# Patient Record
Sex: Female | Born: 1990 | Race: Black or African American | Hispanic: No | Marital: Single | Smoking: Never smoker
Health system: Southern US, Community
[De-identification: ages and names within clinical notes are randomized; demographics above are authoritative.]

## PROBLEM LIST (undated history)

## (undated) DIAGNOSIS — R011 Cardiac murmur, unspecified: Secondary | ICD-10-CM

## (undated) DIAGNOSIS — T7840XA Allergy, unspecified, initial encounter: Secondary | ICD-10-CM

## (undated) DIAGNOSIS — Z309 Encounter for contraceptive management, unspecified: Secondary | ICD-10-CM

## (undated) DIAGNOSIS — Z9289 Personal history of other medical treatment: Secondary | ICD-10-CM

## (undated) HISTORY — DX: Cardiac murmur, unspecified: R01.1

## (undated) HISTORY — DX: Allergy, unspecified, initial encounter: T78.40XA

## (undated) HISTORY — DX: Encounter for contraceptive management, unspecified: Z30.9

## (undated) HISTORY — DX: Personal history of other medical treatment: Z92.89

---
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
## 2011-02-17 ENCOUNTER — Other Ambulatory Visit (INDEPENDENT_AMBULATORY_CARE_PROVIDER_SITE_OTHER): Payer: 59

## 2011-02-17 DIAGNOSIS — Z23 Encounter for immunization: Secondary | ICD-10-CM

## 2011-06-08 ENCOUNTER — Encounter: Payer: Self-pay | Admitting: Family Medicine

## 2011-06-21 ENCOUNTER — Encounter: Payer: Self-pay | Admitting: Family Medicine

## 2011-06-23 ENCOUNTER — Encounter: Payer: Self-pay | Admitting: Medical

## 2011-06-23 ENCOUNTER — Ambulatory Visit (INDEPENDENT_AMBULATORY_CARE_PROVIDER_SITE_OTHER): Payer: 59 | Admitting: Medical

## 2011-06-23 DIAGNOSIS — N946 Dysmenorrhea, unspecified: Secondary | ICD-10-CM

## 2011-06-23 DIAGNOSIS — Z309 Encounter for contraceptive management, unspecified: Secondary | ICD-10-CM

## 2011-06-23 DIAGNOSIS — R5381 Other malaise: Secondary | ICD-10-CM

## 2011-06-23 DIAGNOSIS — R5383 Other fatigue: Secondary | ICD-10-CM

## 2011-06-23 DIAGNOSIS — IMO0001 Reserved for inherently not codable concepts without codable children: Secondary | ICD-10-CM

## 2011-06-23 DIAGNOSIS — Z0000 Encounter for general adult medical examination without abnormal findings: Secondary | ICD-10-CM

## 2011-06-23 LAB — CBC WITH DIFFERENTIAL/PLATELET
Basophils Absolute: 0 10*3/uL (ref 0.0–0.1)
Basophils Relative: 0 % (ref 0–1)
HCT: 38.1 % (ref 36.0–46.0)
Hemoglobin: 12.6 g/dL (ref 12.0–15.0)
Lymphocytes Relative: 46 % (ref 12–46)
MCHC: 33.1 g/dL (ref 30.0–36.0)
Monocytes Absolute: 0.3 10*3/uL (ref 0.1–1.0)
Monocytes Relative: 5 % (ref 3–12)
Neutro Abs: 2.2 10*3/uL (ref 1.7–7.7)
Neutrophils Relative %: 47 % (ref 43–77)
WBC: 4.6 10*3/uL (ref 4.0–10.5)

## 2011-06-23 LAB — POCT URINALYSIS DIPSTICK
Blood, UA: NEGATIVE
Protein, UA: NEGATIVE
Spec Grav, UA: 1.015
Urobilinogen, UA: NEGATIVE
pH, UA: 6

## 2011-06-23 LAB — LIPID PANEL
Cholesterol: 123 mg/dL (ref 0–200)
HDL: 58 mg/dL (ref >39)
LDL Cholesterol: 54 mg/dL (ref 0–99)
Triglycerides: 53 mg/dL (ref <150)
VLDL: 11 mg/dL (ref 0–40)

## 2011-06-23 LAB — COMPREHENSIVE METABOLIC PANEL
Albumin: 5 g/dL (ref 3.5–5.2)
BUN: 8 mg/dL (ref 6–23)
CO2: 26 mEq/L (ref 19–32)
Calcium: 9.5 mg/dL (ref 8.4–10.5)
Chloride: 105 mEq/L (ref 96–112)
Creat: 0.84 mg/dL (ref 0.50–1.10)
Glucose, Bld: 81 mg/dL (ref 70–99)
Potassium: 4.1 mEq/L (ref 3.5–5.3)

## 2011-06-23 LAB — POCT URINE PREGNANCY: Preg Test, Ur: NEGATIVE

## 2011-06-23 MED ORDER — NORGESTIMATE-ETH ESTRADIOL 0.25-35 MG-MCG PO TABS: 1.0000 | ORAL_TABLET | Freq: Every day | ORAL | Status: DC

## 2011-06-23 NOTE — Patient Instructions (Signed)
I recommend you take a multivitamin daily.  Consider taking a vitamin B complex vitamin daily as well. Eat a variety of foods, particularly fruits and vegetables, exercise regularly.  Preventative Care for Adults - Female      MAINTAIN REGULAR HEALTH EXAMS:  A routine yearly physical is a good way to check in with your primary care provider about your health and preventive screening. It is also an opportunity to share updates about your health and any concerns you have, and receive a thorough all-over exam.   Most health insurance companies pay for at least some preventative services.  Check with your health plan for specific coverages.  WHAT PREVENTATIVE SERVICES DO WOMEN NEED?  Adult women should have their weight and blood pressure checked regularly.   Women age 55 and older should have their cholesterol levels checked regularly.  Women should be screened for cervical cancer with a Pap smear and pelvic exam beginning at either age 47, or 3 years after they become sexually activity.    Breast cancer screening generally begins at age 40 with a mammogram and breast exam by your primary care provider.    Beginning at age 57 and continuing to age 41, women should be screened for colorectal cancer.  Certain people may need continued testing until age 84.  Updating vaccinations is part of preventative care.  Vaccinations help protect against diseases such as the flu.  Osteoporosis is a disease in which the bones lose minerals and strength as we age. Women ages 49 and over should discuss this with their caregivers, as should women after menopause who have other risk factors.  Lab tests are generally done as part of preventative care to screen for anemia and blood disorders, to screen for problems with the kidneys and liver, to screen for bladder problems, to check blood sugar, and to check your cholesterol level.  Preventative services generally include counseling about diet, exercise,  avoiding tobacco, drugs, excessive alcohol consumption, and sexually transmitted infections.    GENERAL RECOMMENDATIONS FOR GOOD HEALTH:  Healthy diet:  Eat a variety of foods, including fruit, vegetables, animal or vegetable protein, such as meat, fish, chicken, and eggs, or beans, lentils, tofu, and grains, such as rice.  Drink plenty of water daily.  Decrease saturated fat in the diet, avoid lots of red meat, processed foods, sweets, fast foods, and fried foods.  Exercise:  Aerobic exercise helps maintain good heart health. At least 30-40 minutes of moderate-intensity exercise is recommended. For example, a brisk walk that increases your heart rate and breathing. This should be done on most days of the week.   Find a type of exercise or a variety of exercises that you enjoy so that it becomes a part of your daily life.  Examples are running, walking, swimming, water aerobics, and biking.  For motivation and support, explore group exercise such as aerobic class, spin class, Zumba, Yoga,or  martial arts, etc.    Set exercise goals for yourself, such as a certain weight goal, walk or run in a race such as a 5k walk/run.  Speak to your primary care provider about exercise goals.  Disease prevention:  If you smoke or chew tobacco, find out from your caregiver how to quit. It can literally save your life, no matter how long you have been a tobacco user. If you do not use tobacco, never begin.   Maintain a healthy diet and normal weight. Increased weight leads to problems with blood pressure and diabetes.  The Body Mass Index or BMI is a way of measuring how much of your body is fat. Having a BMI above 27 increases the risk of heart disease, diabetes, hypertension, stroke and other problems related to obesity. Your caregiver can help determine your BMI and based on it develop an exercise and dietary program to help you achieve or maintain this important measurement at a healthful  level.  High blood pressure causes heart and blood vessel problems.  Persistent high blood pressure should be treated with medicine if weight loss and exercise do not work.   Fat and cholesterol leaves deposits in your arteries that can block them. This causes heart disease and vessel disease elsewhere in your body.  If your cholesterol is found to be high, or if you have heart disease or certain other medical conditions, then you may need to have your cholesterol monitored frequently and be treated with medication.   Ask if you should have a cardiac stress test if your history suggests this. A stress test is a test done on a treadmill that looks for heart disease. This test can find disease prior to there being a problem.  Menopause can be associated with physical symptoms and risks. Hormone replacement therapy is available to decrease these. You should talk to your caregiver about whether starting or continuing to take hormones is right for you.   Osteoporosis is a disease in which the bones lose minerals and strength as we age. This can result in serious bone fractures. Risk of osteoporosis can be identified using a bone density scan. Women ages 46 and over should discuss this with their caregivers, as should women after menopause who have other risk factors. Ask your caregiver whether you should be taking a calcium supplement and Vitamin D, to reduce the rate of osteoporosis.   Avoid drinking alcohol in excess (more than two drinks per day).  Avoid use of street drugs. Do not share needles with anyone. Ask for professional help if you need assistance or instructions on stopping the use of alcohol, cigarettes, and/or drugs.  Brush your teeth twice a day with fluoride toothpaste, and floss once a day. Good oral hygiene prevents tooth decay and gum disease. The problems can be painful, unattractive, and can cause other health problems. Visit your dentist for a routine oral and dental check up and  preventive care every 6-12 months.   Look at your skin regularly.  Use a mirror to look at your back. Notify your caregivers of changes in moles, especially if there are changes in shapes, colors, a size larger than a pencil eraser, an irregular border, or development of new moles.  Safety:  Use seatbelts 100% of the time, whether driving or as a passenger.  Use safety devices such as hearing protection if you work in environments with loud noise or significant background noise.  Use safety glasses when doing any work that could send debris in to the eyes.  Use a helmet if you ride a bike or motorcycle.  Use appropriate safety gear for contact sports.  Talk to your caregiver about gun safety.  Use sunscreen with a SPF (or skin protection factor) of 15 or greater.  Lighter skinned people are at a greater risk of skin cancer. Don't forget to also wear sunglasses in order to protect your eyes from too much damaging sunlight. Damaging sunlight can accelerate cataract formation.   Practice safe sex. Use condoms. Condoms are used for birth control and to help reduce the  spread of sexually transmitted infections (or STIs).  Some of the STIs are gonorrhea (the clap), chlamydia, syphilis, trichomonas, herpes, HPV (human papilloma virus) and HIV (human immunodeficiency virus) which causes AIDS. The herpes, HIV and HPV are viral illnesses that have no cure. These can result in disability, cancer and death.   Keep carbon monoxide and smoke detectors in your home functioning at all times. Change the batteries every 6 months or use a model that plugs into the wall.   Vaccinations:  Stay up to date with your tetanus shots and other required immunizations. You should have a booster for tetanus every 10 years. Be sure to get your flu shot every year, since 5%-20% of the [place] population comes down with the flu. The flu vaccine changes each year, so being vaccinated once is not enough. Get your shot in the fall, before  the flu season peaks.   Other vaccines to consider:  Human Papilloma Virus or HPV causes cancer of the cervix, and other infections that can be transmitted from person to person. There is a vaccine for HPV, and females should get immunized between the ages of 53 and 45. It requires a series of 3 shots.   Pneumococcal vaccine to protect against certain types of pneumonia.  This is normally recommended for adults age 43 or older.  However, adults younger than 19 years old with certain underlying conditions such as diabetes, heart or lung disease should also receive the vaccine.  Shingles vaccine to protect against Varicella Zoster if you are older than age 72, or younger than 19 years old with certain underlying illness.  Hepatitis A vaccine to protect against a form of infection of the liver by a virus acquired from food.  Hepatitis B vaccine to protect against a form of infection of the liver by a virus acquired from blood or body fluids, particularly if you work in health care.  If you plan to travel internationally, check with your local health department for specific vaccination recommendations.  Cancer Screening:  Breast cancer screening is essential to preventive care for women. All women age 39 and older should perform a breast self-exam every month. At age 91 and older, women should have their caregiver complete a breast exam each year. Women at ages 33 and older should have a mammogram (x-ray film) of the breasts. Your caregiver can discuss how often you need mammograms.    Cervical cancer screening includes taking a Pap smear (sample of cells examined under a microscope) from the cervix (end of the uterus). It also includes testing for HPV (Human Papilloma Virus, which can cause cervical cancer). Screening and a pelvic exam should begin at age 35, or 3 years after a woman becomes sexually active. Screening should occur every year, with a Pap smear but no HPV testing, up to age 18. After  age 27, you should have a Pap smear every 3 years with HPV testing, if no HPV was found previously.   Most routine colon cancer screening begins at the age of 44. On a yearly basis, doctors may provide special easy to use take-home tests to check for hidden blood in the stool. Sigmoidoscopy or colonoscopy can detect the earliest forms of colon cancer and is life saving. These tests use a small camera at the end of a tube to directly examine the colon. Speak to your caregiver about this at age 51, when routine screening begins (and is repeated every 5 years unless early forms of pre-cancerous polyps or  small growths are found).

## 2011-06-23 NOTE — Progress Notes (Signed)
Subjective:   HPI  Betty Bentley is a 20 y.o. female who presents for a complete physical.  She has a few c/o.  Lately has felt fatigue, less energy than normal.  She denies depression although periods get her down.  Periods are heavy and with bad cramps.  She wants to take OCPs to help with periods.  Her mother is a Publishing rights manager and wants her to have some screening labs today given her fatigue.  Otherwise she has been healthy, runs track, is in school at [employer] for biology.  She denies prior pap smear and has never been sexually active. No other c/o.  Reviewed their medical, surgical, family, social, medication, and allergy history and updated chart as appropriate.  History reviewed. No pertinent past medical history.   Family History  Problem Relation Age of Onset  . Arthritis Maternal Grandfather   . Hypertension Maternal Grandfather   . Thyroid disease Maternal Grandmother   . Heart disease Neg Hx   . Stroke Neg Hx   . Diabetes Neg Hx     No current outpatient prescriptions on file prior to visit.    No Known Allergies   Review of Systems Constitutional: +fatigue; denies fever, chills, sweats, unexpected weight change, anorexia Allergy: negative; denies recent sneezing, itching, congestion Dermatology: denies changing moles, rash, lumps, new worrisome lesions ENT: no runny nose, ear pain, sore throat, hoarseness, sinus pain, teeth pain, tinnitus, hearing loss, epistaxis Cardiology: denies chest pain, palpitations, edema, orthopnea, paroxysmal nocturnal dyspnea Respiratory: denies cough, shortness of breath, dyspnea on exertion, wheezing, hemoptysis Gastroenterology: denies abdominal pain, nausea, vomiting, diarrhea, constipation, blood in stool, changes in bowel movement, dysphagia Hematology: denies bleeding or bruising problems Musculoskeletal: denies arthralgias, myalgias, joint swelling, back pain, neck pain, cramping, gait changes Ophthalmology: denies  vision changes, eye redness, itching, discharge Urology: denies dysuria, difficulty urinating, hematuria, urinary frequency, urgency, incontinence Neurology: no headache, weakness, tingling, numbness, speech abnormality, memory loss, falls, dizziness Psychology: denies depressed mood, agitation, sleep problems     Objective:   Physical Exam  Filed Vitals:   06/23/11 1433  BP: 120/70  Pulse: 72  Temp: 98.1 F (36.7 C)    General appearance: alert, no distress, WD/WN, black female, tall, lean Skin: mild acne of face and back, otherwise no worrisome lesions HEENT: normocephalic, conjunctiva/corneas normal, sclerae anicteric, PERRLA, EOMi, nares patent, no discharge or erythema, pharynx normal Oral cavity: MMM, tongue normal, teeth normal Neck: supple, no lymphadenopathy, no thyromegaly, no masses, normal ROM, no bruits Chest: non tender, normal shape and expansion Breasts: nontender, no masses or lumps, no skin changes, no axillary lymphadenopathy, benign, chaperoned by nurse Heart: RRR, normal S1, S2, no murmurs Lungs: CTA bilaterally, no wheezes, rhonchi, or rales Abdomen: +bs, soft, non tender, non distended, no masses, no hepatomegaly, no splenomegaly, no bruits Back: non tender, normal ROM, no scoliosis Musculoskeletal: upper extremities non tender, no obvious deformity, normal ROM throughout, lower extremities non tender, no obvious deformity, normal ROM throughout Extremities: no edema, no cyanosis, no clubbing Pulses: 2+ symmetric, upper and lower extremities, normal cap refill Neurological: alert, oriented x 3, CN2-12 intact, strength normal upper extremities and lower extremities, sensation normal throughout, DTRs 2+ throughout, no cerebellar signs, gait normal Psychiatric: normal affect, behavior normal, pleasant  Pelvic: deferred   Assessment :    Encounter Diagnoses  Name Primary?  . General medical examination Yes  . Fatigue   . Dysmenorrhea   . Contraception        Plan:  Physical exam - discussed healthy lifestyle, diet, exercise, preventative care, vaccinations, and addressed their concerns.    Fatigue - recommend healthy diet, daily multivitamin and B complex vitamins, labs today  Dysmenorrhea and contraception - discussed risks of contraception, will begin Sprintec. She denies prior sexual intercourse or activity, thus no pelvic/pap performed. Breast exam and discussion of SBE performed.  Advised pap by age 41.

## 2011-06-24 ENCOUNTER — Telehealth: Payer: Self-pay | Admitting: *Deleted

## 2011-06-24 NOTE — Telephone Encounter (Addendum)
Message copied by Dorthula Perfect on Fri Jun 24, 2011 12:40 PM ------      Message from: Aleen Campi, DAVID S      Created: Fri Jun 24, 2011 12:23 PM       ALL labs normal- liver, kidney, lytes, blood counts, urine, thyroid and Vit D.   Ask her to take daily multivitamin + Vitamin B complex OTC, eat breakfast every day, small meals throughout the day with good healthy choices such as fruits, whole grains, berries, etc.   Have her call report in 75mo to let us know how she is doing.  Pt notified of lab results.  Pt will take OTC Multivitamin with Vitamin B complex and will call in 1 month to let us know how she is doing.  Pt will be by Monday to pick up copy of lab results.  Printed out copy of labs for pt.  CM, LPN

## 2012-06-09 ENCOUNTER — Other Ambulatory Visit: Payer: Self-pay | Admitting: Medical

## 2012-06-11 NOTE — Telephone Encounter (Signed)
PATIENT NEEDS TO SCHEDULE A OFFICE VISIT BEFORE HER RX RUNS OUT.

## 2012-06-18 ENCOUNTER — Ambulatory Visit (INDEPENDENT_AMBULATORY_CARE_PROVIDER_SITE_OTHER): Payer: 59 | Admitting: Medical

## 2012-06-18 ENCOUNTER — Encounter: Payer: Self-pay | Admitting: Medical

## 2012-06-18 VITALS — BP 100/68 | HR 80 | Temp 98.4°F | Resp 16 | Ht 68.5 in | Wt 146.0 lb

## 2012-06-18 DIAGNOSIS — J309 Allergic rhinitis, unspecified: Secondary | ICD-10-CM

## 2012-06-18 DIAGNOSIS — Z0000 Encounter for general adult medical examination without abnormal findings: Secondary | ICD-10-CM

## 2012-06-18 DIAGNOSIS — Z309 Encounter for contraceptive management, unspecified: Secondary | ICD-10-CM

## 2012-06-18 LAB — POCT URINALYSIS DIPSTICK
Blood, UA: NEGATIVE
Ketones, UA: NEGATIVE
Spec Grav, UA: 1.02
pH, UA: 5

## 2012-06-18 MED ORDER — NORGESTIMATE-ETH ESTRADIOL 0.25-35 MG-MCG PO TABS: 1.0000 | ORAL_TABLET | Freq: Every day | ORAL | Status: DC

## 2012-06-18 MED ORDER — LORATADINE 10 MG PO TABS: 10.0000 mg | ORAL_TABLET | Freq: Every day | ORAL | Status: DC

## 2012-06-18 NOTE — Progress Notes (Signed)
Subjective:   HPI  Betty Bentley is a 21 y.o. female who presents for a complete physical.  I last saw her a year ago for the same.   She has been doing well.  In school for biology/pre-med at [employer].   making A-Bs, focused on her education, eating healthy, exercising.   She does have some allergy problems.  Gets runny nose, sneezing, cough, irritated throat with recent changes in temperature outside. Uses Claritin some.  Wants script for generic loratadine.     LMP 2 wk ago.  Has been on Sprintec 1 year now which has greatly helped both her painful and irregular periods.  Periods are now regular, every 28 days, not heavy, doing well, needs refill.  Reviewed their medical, surgical, family, social, medication, and allergy history and updated chart as appropriate.  Past Medical History  Diagnosis Date  . Allergy   . Contraception management      Family History  Problem Relation Age of Onset  . Arthritis Maternal Grandfather   . Hypertension Maternal Grandfather   . Thyroid disease Maternal Grandmother   . Heart disease Neg Hx   . Stroke Neg Hx   . Diabetes Neg Hx     Current Outpatient Prescriptions on File Prior to Visit  Medication Sig Dispense Refill  . DISCONTD: SPRINTEC 28 0.25-35 MG-MCG tablet TAKE ONE TABLET BY MOUTH EVERY DAY  28 each  0  . loratadine (CLARITIN) 10 MG tablet Take 1 tablet (10 mg total) by mouth daily.  30 tablet  11    No Known Allergies   Review of Systems Constitutional: denies fever, chills, sweats, unexpected weight change, anorexia, fatigue Allergy: negative; denies recent sneezing, itching, congestion Dermatology: denies changing moles, rash, lumps, new worrisome lesions ENT: no runny nose, ear pain, sore throat, hoarseness, sinus pain, teeth pain, tinnitus, hearing loss, epistaxis Cardiology: denies chest pain, palpitations, edema, orthopnea, paroxysmal nocturnal dyspnea Respiratory: denies cough, shortness of breath, dyspnea on  exertion, wheezing, hemoptysis Gastroenterology: denies abdominal pain, nausea, vomiting, diarrhea, constipation, blood in stool, changes in bowel movement, dysphagia Hematology: denies bleeding or bruising problems Musculoskeletal: denies arthralgias, myalgias, joint swelling, back pain, neck pain, cramping, gait changes Ophthalmology: denies vision changes, eye redness, itching, discharge Urology: denies dysuria, difficulty urinating, hematuria, urinary frequency, urgency, incontinence Neurology: no headache, weakness, tingling, numbness, speech abnormality, memory loss, falls, dizziness Psychology: denies depressed mood, agitation, sleep problems     Objective:   Physical Exam  Filed Vitals:   06/18/12 1112  BP: 100/68  Pulse: 80  Temp: 98.4 F (36.9 C)  Resp: 16    General appearance: alert, no distress, WD/WN, black female, tall, lean Skin: mild acne of face and back, otherwise no worrisome lesions HEENT: normocephalic, conjunctiva/corneas normal, sclerae anicteric, PERRLA, EOMi, nares patent, no discharge or erythema, pharynx normal Oral cavity: MMM, tongue normal, teeth normal Neck: supple, no lymphadenopathy, no thyromegaly, no masses, normal ROM, no bruits Chest: non tender, normal shape and expansion Breasts: deferred Heart: RRR, normal S1, S2, no murmurs Lungs: CTA bilaterally, no wheezes, rhonchi, or rales Abdomen: +bs, soft, non tender, non distended, no masses, no hepatomegaly, no splenomegaly, no bruits Back: non tender, normal ROM, no scoliosis Musculoskeletal: upper extremities non tender, no obvious deformity, normal ROM throughout, lower extremities non tender, no obvious deformity, normal ROM throughout Extremities: no edema, no cyanosis, no clubbing Pulses: 2+ symmetric, upper and lower extremities, normal cap refill Neurological: alert, oriented x 3, CN2-12 intact, strength normal upper extremities and lower  extremities, sensation normal throughout, DTRs 2+  throughout, no cerebellar signs, gait normal Psychiatric: normal affect, behavior normal, pleasant  Pelvic: deferred   Assessment :    Encounter Diagnoses  Name Primary?  . Routine general medical examination at a health care facility Yes  . Contraception management   . Allergic rhinitis       Plan:    Physical exam - discussed healthy lifestyle, diet, exercise, preventative care, vaccinations, and addressed their concerns.  She is UTD on vaccines.  Advised yearly flu shot in the fall.  Reviewed labs from 06/2011, all normal.   Contraception management - discussed risks/benefits of OCPs, discussed safe sex, prevention.  She has done well on Sprintec.  C/t same medication.  Will need pap age 16yo.    Allergic rhinitis - avoid triggers, script for Loratidine, cal/return if not helping.   RTC 1 year, sooner prn.

## 2012-06-20 ENCOUNTER — Telehealth: Payer: Self-pay | Admitting: Medical

## 2012-07-20 ENCOUNTER — Telehealth: Payer: Self-pay | Admitting: Medical

## 2012-07-20 DIAGNOSIS — Z0000 Encounter for general adult medical examination without abnormal findings: Secondary | ICD-10-CM

## 2012-07-20 MED ORDER — LORATADINE 10 MG PO TABS: 10.0000 mg | ORAL_TABLET | Freq: Every day | ORAL | Status: DC

## 2012-07-20 NOTE — Telephone Encounter (Signed)
LM

## 2013-05-22 ENCOUNTER — Telehealth: Payer: Self-pay | Admitting: Medical

## 2013-05-22 ENCOUNTER — Other Ambulatory Visit: Payer: Self-pay | Admitting: Medical

## 2013-05-22 NOTE — Telephone Encounter (Signed)
Refill sent.

## 2013-05-22 NOTE — Telephone Encounter (Signed)
She should have refill through July (last physical date).   I prescribe year's worth.  Thus, make sure or call out refill to get her through til physical time.

## 2013-06-11 DIAGNOSIS — R011 Cardiac murmur, unspecified: Secondary | ICD-10-CM

## 2013-06-11 DIAGNOSIS — Z9289 Personal history of other medical treatment: Secondary | ICD-10-CM

## 2013-06-11 HISTORY — DX: Cardiac murmur, unspecified: R01.1

## 2013-06-11 HISTORY — DX: Personal history of other medical treatment: Z92.89

## 2013-06-17 ENCOUNTER — Telehealth: Payer: Self-pay | Admitting: Medical

## 2013-06-17 MED ORDER — NORGESTIMATE-ETH ESTRADIOL 0.25-35 MG-MCG PO TABS: 1.0000 | ORAL_TABLET | Freq: Every day | ORAL | Status: DC

## 2013-06-17 NOTE — Telephone Encounter (Signed)
Medication refill sent to the pharmacy. CLS Appointment 07/10/13

## 2013-07-03 ENCOUNTER — Encounter: Payer: Self-pay | Admitting: Internal Medicine

## 2013-07-10 ENCOUNTER — Encounter: Payer: Self-pay | Admitting: Internal Medicine

## 2013-07-10 ENCOUNTER — Other Ambulatory Visit (HOSPITAL_COMMUNITY)
Admission: RE | Admit: 2013-07-10 | Discharge: 2013-07-10 | Disposition: A | Discharge status: Home / Self Care | Payer: 59 | Source: Ambulatory Visit | Attending: Medical | Admitting: Medical

## 2013-07-10 ENCOUNTER — Encounter: Payer: Self-pay | Admitting: Medical

## 2013-07-10 ENCOUNTER — Telehealth: Payer: Self-pay | Admitting: Internal Medicine

## 2013-07-10 ENCOUNTER — Ambulatory Visit (INDEPENDENT_AMBULATORY_CARE_PROVIDER_SITE_OTHER): Payer: 59 | Admitting: Medical

## 2013-07-10 VITALS — BP 110/66 | HR 76 | Temp 98.3°F | Resp 20 | Ht 69.0 in | Wt 139.0 lb

## 2013-07-10 DIAGNOSIS — Z113 Encounter for screening for infections with a predominantly sexual mode of transmission: Secondary | ICD-10-CM | POA: Insufficient documentation

## 2013-07-10 DIAGNOSIS — Z0000 Encounter for general adult medical examination without abnormal findings: Secondary | ICD-10-CM

## 2013-07-10 DIAGNOSIS — Z124 Encounter for screening for malignant neoplasm of cervix: Secondary | ICD-10-CM

## 2013-07-10 DIAGNOSIS — Z01419 Encounter for gynecological examination (general) (routine) without abnormal findings: Secondary | ICD-10-CM | POA: Insufficient documentation

## 2013-07-10 DIAGNOSIS — R011 Cardiac murmur, unspecified: Secondary | ICD-10-CM

## 2013-07-10 DIAGNOSIS — R002 Palpitations: Secondary | ICD-10-CM

## 2013-07-10 LAB — CBC WITH DIFFERENTIAL/PLATELET
Eosinophils Absolute: 0.1 10*3/uL (ref 0.0–0.7)
Hemoglobin: 12.3 g/dL (ref 12.0–15.0)
Lymphocytes Relative: 45 % (ref 12–46)
Lymphs Abs: 2.4 10*3/uL (ref 0.7–4.0)
Monocytes Relative: 6 % (ref 3–12)
Neutro Abs: 2.6 10*3/uL (ref 1.7–7.7)
Neutrophils Relative %: 47 % (ref 43–77)
Platelets: 279 10*3/uL (ref 150–400)
RBC: 3.91 MIL/uL (ref 3.87–5.11)
WBC: 5.4 10*3/uL (ref 4.0–10.5)

## 2013-07-10 LAB — POCT URINALYSIS DIPSTICK
Bilirubin, UA: NEGATIVE
Blood, UA: NEGATIVE
Glucose, UA: NEGATIVE
Nitrite, UA: NEGATIVE
Spec Grav, UA: 1.02

## 2013-07-10 LAB — HM PAP SMEAR: HM Pap smear: NEGATIVE

## 2013-07-10 MED ORDER — NORGESTIMATE-ETH ESTRADIOL 0.25-35 MG-MCG PO TABS: 1.0000 | ORAL_TABLET | Freq: Every day | ORAL | Status: DC

## 2013-07-10 MED ORDER — LORATADINE 10 MG PO TABS: 10.0000 mg | ORAL_TABLET | Freq: Every day | ORAL | Status: DC

## 2013-07-10 NOTE — Progress Notes (Signed)
Subjective:   HPI  Betty Bentley is a 22 y.o. female who presents for a complete physical.  I last saw her a year ago for the same.   She has been doing well.  In school for biology/pre-med at [employer].   making A-Bs, focused on her education, eating healthy, exercising.  Will be taking MCAT next month, plans to pursue medicine.    Takes Claritin for allergies without c/o.  Compliant with OCPs, no concerns, no side effects, periods regular.  Began having sex age 49yo, has had 3 partners, but uses condoms every time.  Today will be her first pap smear.  She did have STD screening at student health age 53yo.   Gets occasional palpitations, heaviness in chest, worse with stress. No family hx/o heart disease, no hx/o murmur, no prior heart problems, no major concerns with exercise, no CP, SOB, edema.   Reviewed their medical, surgical, family, social, medication, and allergy history and updated chart as appropriate.  Past Medical History  Diagnosis Date  . Allergy   . Contraception management      Family History  Problem Relation Age of Onset  . Arthritis Maternal Grandfather   . Hypertension Maternal Grandfather   . Thyroid disease Maternal Grandmother   . Heart disease Neg Hx   . Stroke Neg Hx   . Diabetes Neg Hx     Current Outpatient Prescriptions on File Prior to Visit  Medication Sig Dispense Refill  . loratadine (CLARITIN) 10 MG tablet Take 1 tablet (10 mg total) by mouth daily.  90 tablet  3   No current facility-administered medications on file prior to visit.    No Known Allergies   Review of Systems Constitutional: denies fever, chills, sweats, unexpected weight change, anorexia, fatigue Allergy: negative; denies recent sneezing, itching, congestion Dermatology: denies changing moles, rash, lumps, new worrisome lesions ENT: no runny nose, ear pain, sore throat, hoarseness, sinus pain, teeth pain, tinnitus, hearing loss, epistaxis Cardiology: denies chest  pain, +occasional palpitations, no edema, orthopnea, paroxysmal nocturnal dyspnea Respiratory: denies cough, shortness of breath, dyspnea on exertion, wheezing, hemoptysis Gastroenterology: denies abdominal pain, nausea, vomiting, diarrhea, constipation, blood in stool, changes in bowel movement, dysphagia Hematology: denies bleeding or bruising problems Musculoskeletal: denies arthralgias, myalgias, joint swelling, back pain, neck pain, cramping, gait changes Ophthalmology: denies vision changes, eye redness, itching, discharge Urology: denies dysuria, difficulty urinating, hematuria, urinary frequency, urgency, incontinence Neurology: no headache, weakness, tingling, numbness, speech abnormality, memory loss, falls, dizziness Psychology: denies depressed mood, agitation, sleep problems     Objective:   Physical Exam  Filed Vitals:   07/10/13 1135  BP: 110/66  Pulse: 76  Temp: 98.3 F (36.8 C)  Resp: 20    General appearance: alert, no distress, WD/WN, black female, tall, lean Skin: mild acne of face and back, otherwise no worrisome lesions HEENT: normocephalic, conjunctiva/corneas normal, sclerae anicteric, PERRLA, EOMi, nares patent, no discharge or erythema, pharynx normal Oral cavity: MMM, tongue normal, teeth normal Neck: supple, no lymphadenopathy, no thyromegaly, no masses, normal ROM, no bruits Chest: non tender, normal shape and expansion Breasts: deferred Heart: brief faint 1-2/6 systolic murmur heard best left upper sternal border, otherwise RRR, normal  S2 Lungs: CTA bilaterally, no wheezes, rhonchi, or rales Abdomen: +bs, soft, non tender, non distended, no masses, no hepatomegaly, no splenomegaly, no bruits Back: non tender, normal ROM, no scoliosis Musculoskeletal: upper extremities non tender, no obvious deformity, normal ROM throughout, lower extremities non tender, no obvious deformity, normal ROM throughout  Extremities: no edema, no cyanosis, no  clubbing Pulses: 2+ symmetric, upper and lower extremities, normal cap refill Neurological: alert, oriented x 3, CN2-12 intact, strength normal upper extremities and lower extremities, sensation normal throughout, DTRs 2+ throughout, no cerebellar signs, gait normal Psychiatric: normal affect, behavior normal, pleasant  Pelvic: normal external genitalia, tender speculum exam, mucosa pink/moist, no abnormal discharge, cervix with slight erythema, no other lesions, adnexa and uterus not enlarged, nontender Rectal: deferred   Assessment :    Encounter Diagnoses  Name Primary?  . Routine general medical examination at a health care facility Yes  . Screening for cervical cancer   . Screen for STD (sexually transmitted disease)   . Heart murmur   . Palpitations       Plan:    Physical exam - discussed healthy lifestyle, diet, exercise, preventative care, vaccinations, and addressed their concerns.  She is UTD on vaccines.    Contraception management - discussed risks/benefits of OCPs, discussed safe sex, prevention.  She has done well on Sprintec.  C/t same medication.  Pap and std screening today.  Allergic rhinitis - avoid triggers, c/t Loratidine, cal/return if not helping.   New murmur today - discussed case with Dr. Lynelle Doctor, supervising physician who also examined.   Will send for echocardiogram.   F/u pending studies.

## 2013-07-10 NOTE — Patient Instructions (Signed)
Heart Murmur  A heart murmur is an extra sound heard by your caregiver when listening to your heart with a device called a stethoscope. The sound might be a "hum" or "whoosh" sound heard when the heart beats. The sound comes from turbulence when blood flows through the heart. There are two types of heart murmurs:   Innocent (Harmless) murmurs: Most people with this type of heart murmur do not have signs or symptoms of heart problems. Many children have innocent heart murmurs. When an innocent heart murmur is found, there is no need to get tests or do treatment. Also, there is no need to restrict activities or stop playing sports. Innocent heart murmurs may be caused by many things. For example, it might be caused by a tiny hole or defect in the wall of the heart. These defects often close as a child grows. An innocent heart murmur may be heard by an examining clinician throughout your life. If you see a new caregiver, please let him or her know this was found during past exams.   Abnormal murmurs: May have signs and symptoms of heart problems. These types of murmurs can occur in children and adults. In children, abnormal heart murmurs are typically caused from heart defects that are present at birth. In adults, abnormal murmurs are usually from heart valve problems caused by disease, infection, or aging.  SYMPTOMS    Innocent (Harmless) murmurs do not cause symptoms or require you to limit physical activity.   Many people with abnormal murmurs may or may not have symptoms. If symptoms do develop, they might include:   Shortness of breath.   Blue coloring of the skin, especially on the fingertips.   Chest pain.   Palpitations or feeling a "fluttering" or a "skipped" heart beat.   Fainting.   Persistent cough.   Getting tired much faster than expected.  DIAGNOSIS   A heart murmur might be heard during a pre-sports physical or during any type of examination. When a murmur is heard, it may suggest a possible  problem. When this happens, your caregiver may ask you to see a heart specialist (cardiologist). You may also be asked to undergo one or more heart tests. In these cases, testing may vary depending upon what your caregiver heard. Tests for a heart murmur might include one or more of the following:   EKG (electrocardiogram).   Echocardiogram.   Cardiac MRI.  For children and adults who have an abnormal heart murmur and want to play sports, it is important to complete testing, review test results, and receive recommendations from your caregiver. If heart disease is present, it may be risky to play.  Finding out the results of your test  Not all test results are available during your visit. If your test results are not back during the visit, make an appointment with your caregiver to find out the results. Do not assume everything is normal if you have not heard from your caregiver or the medical facility. It is important for you to follow up on all of your test results.   TREATMENT   As noted above, innocent (harmless) murmurs require no treatment or activity restriction. If the murmur represents a problem with the heart, treatment will depend upon the exact nature of the problem. In these cases, medicine or surgery may be needed to treat the problem.  HOME CARE INSTRUCTIONS  If you want to participate in sports or other types of strenuous physical activity, it is important to   discuss this first with your caregiver. If the murmur represents a problem with the heart and you choose to participate in sports, there is a small chance that a serious problem (including sudden death) could result.   SEEK MEDICAL CARE IF:    You feel that your symptoms are slowly worsening.   You develop any new symptoms that cause concern.   You feel that you are having side effects from any medicines prescribed.  SEEK IMMEDIATE MEDICAL CARE IF:    Chest pain develops.   You are short of breath.   You notice that your heart beats  irregularly often enough to cause you to worry.   You have fainting spells.   There is a worsening of any problems that brought you or your child in for medical care.  Document Released: 01/05/2005 Document Revised: 02/20/2012 Document Reviewed: 02/05/2008  [employer] Patient Information 2014 [employer].

## 2013-07-10 NOTE — Telephone Encounter (Signed)
Auth # [id number] good through 08/24/13 this is for echocardiogram. Her appt for this is set up on Wednesday August 6 @ 10:15am at Dr. Tawana Scale office

## 2013-07-11 LAB — TSH: TSH: 1.488 u[IU]/mL (ref 0.350–4.500)

## 2013-07-15 ENCOUNTER — Telehealth: Payer: Self-pay | Admitting: Internal Medicine

## 2013-07-15 NOTE — Telephone Encounter (Signed)
Pt would like to be switched from Dr. York Spaniel to Dr. Tonny Bollman for her Echocardiogram. She has an appt on Wednesday with Dr. Donnie Aho but doesn't want to go see him. Dr. Randolm Idol # is [phone]

## 2013-07-16 NOTE — Telephone Encounter (Signed)
Called Dr. Excell Seltzer office and he only see's Aortic Stenosis pt.  1st avail with Betty Bentley 9/5 and pt said she is going off to college so she will see Dr. Donnie Aho tomorrow.

## 2013-07-17 ENCOUNTER — Telehealth: Payer: Self-pay | Admitting: Medical

## 2013-07-17 NOTE — Telephone Encounter (Signed)
PT CALLED AND REQUESTING WE CALL [facility]/DR. COOPER AND GIVE OUT NPI FOR AN APPT SHE HAS THERE. SHE IS A [place] [facility] PT. DONE KDS

## 2013-07-26 ENCOUNTER — Other Ambulatory Visit (HOSPITAL_COMMUNITY): Payer: Self-pay | Admitting: Radiology

## 2013-07-26 ENCOUNTER — Ambulatory Visit (HOSPITAL_COMMUNITY): Payer: 59 | Attending: Internal Medicine | Admitting: Radiology

## 2013-07-26 DIAGNOSIS — R002 Palpitations: Secondary | ICD-10-CM | POA: Insufficient documentation

## 2013-07-26 DIAGNOSIS — R011 Cardiac murmur, unspecified: Secondary | ICD-10-CM

## 2013-07-26 NOTE — Addendum Note (Signed)
Addended by: Oren Bracket on: 07/26/2013 12:45 PM   Modules accepted: Orders

## 2013-07-26 NOTE — Progress Notes (Signed)
Echocardiogram performed.  

## 2013-10-17 ENCOUNTER — Other Ambulatory Visit: Payer: Self-pay

## 2014-01-21 ENCOUNTER — Telehealth: Payer: Self-pay | Admitting: Medical

## 2014-01-21 ENCOUNTER — Other Ambulatory Visit: Payer: Self-pay | Admitting: Family Medicine

## 2014-01-21 DIAGNOSIS — Z0000 Encounter for general adult medical examination without abnormal findings: Secondary | ICD-10-CM

## 2014-01-21 MED ORDER — LORATADINE 10 MG PO TABS: 10.0000 mg | ORAL_TABLET | Freq: Every day | ORAL | Status: DC

## 2014-01-21 NOTE — Telephone Encounter (Signed)
I sent her [id number] supply to her pharmacy. CLS

## 2014-02-07 ENCOUNTER — Encounter (HOSPITAL_COMMUNITY): Payer: Self-pay | Admitting: Emergency Medicine

## 2014-02-07 DIAGNOSIS — Z79899 Other long term (current) drug therapy: Secondary | ICD-10-CM | POA: Insufficient documentation

## 2014-02-07 DIAGNOSIS — R1033 Periumbilical pain: Secondary | ICD-10-CM | POA: Insufficient documentation

## 2014-02-07 DIAGNOSIS — R197 Diarrhea, unspecified: Secondary | ICD-10-CM | POA: Insufficient documentation

## 2014-02-07 DIAGNOSIS — R112 Nausea with vomiting, unspecified: Secondary | ICD-10-CM | POA: Insufficient documentation

## 2014-02-07 DIAGNOSIS — N390 Urinary tract infection, site not specified: Secondary | ICD-10-CM | POA: Insufficient documentation

## 2014-02-07 DIAGNOSIS — Z3202 Encounter for pregnancy test, result negative: Secondary | ICD-10-CM | POA: Insufficient documentation

## 2014-02-07 LAB — CBC WITH DIFFERENTIAL/PLATELET
BASOS ABS: 0.1 10*3/uL (ref 0.0–0.1)
BASOS PCT: 1 % (ref 0–1)
Eosinophils Absolute: 0 10*3/uL (ref 0.0–0.7)
Eosinophils Relative: 0 % (ref 0–5)
HEMATOCRIT: 40.1 % (ref 36.0–46.0)
HEMOGLOBIN: 13.9 g/dL (ref 12.0–15.0)
LYMPHS PCT: 31 % (ref 12–46)
Lymphs Abs: 1.7 10*3/uL (ref 0.7–4.0)
MCH: 31.5 pg (ref 26.0–34.0)
MCHC: 34.7 g/dL (ref 30.0–36.0)
MCV: 90.9 fL (ref 78.0–100.0)
Monocytes Absolute: 0.5 10*3/uL (ref 0.1–1.0)
Monocytes Relative: 9 % (ref 3–12)
Neutro Abs: 3.1 10*3/uL (ref 1.7–7.7)
Neutrophils Relative %: 59 % (ref 43–77)
Platelets: 272 10*3/uL (ref 150–400)
RBC: 4.41 MIL/uL (ref 3.87–5.11)
RDW: 12.5 % (ref 11.5–15.5)
WBC: 5.4 10*3/uL (ref 4.0–10.5)

## 2014-02-07 LAB — COMPREHENSIVE METABOLIC PANEL
ALBUMIN: 4 g/dL (ref 3.5–5.2)
ALT: 17 U/L (ref 0–35)
AST: 51 U/L — ABNORMAL HIGH (ref 0–37)
Alkaline Phosphatase: 45 U/L (ref 39–117)
BUN: 7 mg/dL (ref 6–23)
CHLORIDE: 94 meq/L — AB (ref 96–112)
CO2: 28 mEq/L (ref 19–32)
Calcium: 9.1 mg/dL (ref 8.4–10.5)
Creatinine, Ser: 0.72 mg/dL (ref 0.50–1.10)
GFR calc Af Amer: >90 mL/min (ref >90)
GFR calc non Af Amer: >90 mL/min (ref >90)
Glucose, Bld: 91 mg/dL (ref 70–99)
POTASSIUM: 3.2 meq/L — AB (ref 3.7–5.3)
Sodium: 138 mEq/L (ref 137–147)
Total Bilirubin: 0.3 mg/dL (ref 0.3–1.2)
Total Protein: 8.1 g/dL (ref 6.0–8.3)

## 2014-02-07 LAB — URINE MICROSCOPIC-ADD ON

## 2014-02-07 LAB — URINALYSIS, ROUTINE W REFLEX MICROSCOPIC
BILIRUBIN URINE: NEGATIVE
Glucose, UA: NEGATIVE mg/dL
Ketones, ur: 15 mg/dL — AB
Nitrite: NEGATIVE
PH: 7.5 (ref 5.0–8.0)
Protein, ur: NEGATIVE mg/dL
SPECIFIC GRAVITY, URINE: 1.012 (ref 1.005–1.030)
UROBILINOGEN UA: 1 mg/dL (ref 0.0–1.0)

## 2014-02-07 LAB — LIPASE, BLOOD: LIPASE: 33 U/L (ref 11–59)

## 2014-02-07 LAB — POC URINE PREG, ED: Preg Test, Ur: NEGATIVE

## 2014-02-07 NOTE — ED Notes (Addendum)
Pt is a [employer]ing [employer] and reports abd pain, nausea, and vomiting since Monday.  Seen at Clinic at school and given Phenergan with no relief.  [facility] and started Zofran.  No longer vomiting but continues to have mid abd pain.  Decreased urination and decreased PO intake.

## 2014-02-07 NOTE — ED Notes (Signed)
EMS  C/o abd pain 7/10 since Monday N/V up until today but none today.  Phenergan given by urgent care earlier today.  Neg orthostatic VS 102/70(sitting), 100/60 (standing)

## 2014-02-08 ENCOUNTER — Emergency Department (HOSPITAL_COMMUNITY): Payer: 59

## 2014-02-08 ENCOUNTER — Emergency Department (HOSPITAL_COMMUNITY)
Admission: EM | Admit: 2014-02-08 | Discharge: 2014-02-08 | Disposition: A | Discharge status: Home / Self Care | Payer: 59 | Attending: Emergency Medicine | Admitting: Emergency Medicine

## 2014-02-08 ENCOUNTER — Encounter (HOSPITAL_COMMUNITY): Payer: Self-pay | Admitting: *Deleted

## 2014-02-08 DIAGNOSIS — N390 Urinary tract infection, site not specified: Secondary | ICD-10-CM

## 2014-02-08 DIAGNOSIS — R109 Unspecified abdominal pain: Secondary | ICD-10-CM

## 2014-02-08 MED ORDER — SODIUM CHLORIDE 0.9 % IV BOLUS (SEPSIS)
1000.0000 mL | Freq: Once | INTRAVENOUS | Status: AC
  Administered 2014-02-08: 1000 mL via INTRAVENOUS

## 2014-02-08 MED ORDER — SODIUM CHLORIDE 0.9 % IV BOLUS (SEPSIS): 1000.0000 mL | Freq: Once | INTRAVENOUS | Status: DC

## 2014-02-08 MED ORDER — IOHEXOL 300 MG/ML  SOLN
25.0000 mL | Freq: Once | INTRAMUSCULAR | Status: AC | PRN
  Administered 2014-02-08: 25 mL via ORAL

## 2014-02-08 MED ORDER — TRAMADOL HCL 50 MG PO TABS: 50.0000 mg | ORAL_TABLET | Freq: Four times a day (QID) | ORAL | Status: DC | PRN

## 2014-02-08 MED ORDER — METOCLOPRAMIDE HCL 10 MG PO TABS: 10.0000 mg | ORAL_TABLET | Freq: Four times a day (QID) | ORAL | Status: DC | PRN

## 2014-02-08 MED ORDER — ONDANSETRON HCL 4 MG/2ML IJ SOLN
4.0000 mg | Freq: Once | INTRAMUSCULAR | Status: DC
Start: 2014-02-08 — End: 2014-02-08
  Filled 2014-02-08: qty 2

## 2014-02-08 MED ORDER — IOHEXOL 300 MG/ML  SOLN
100.0000 mL | Freq: Once | INTRAMUSCULAR | Status: AC | PRN
  Administered 2014-02-08: 100 mL via INTRAVENOUS

## 2014-02-08 MED ORDER — SULFAMETHOXAZOLE-TRIMETHOPRIM 800-160 MG PO TABS: 1.0000 | ORAL_TABLET | Freq: Two times a day (BID) | ORAL | Status: DC

## 2014-02-08 MED ORDER — SODIUM CHLORIDE 0.9 % IV BOLUS (SEPSIS)
1000.0000 mL | Freq: Once | INTRAVENOUS | Status: AC
Start: 2014-02-08 — End: 2014-02-08
  Administered 2014-02-08: 1000 mL via INTRAVENOUS

## 2014-02-08 MED ORDER — MORPHINE SULFATE 4 MG/ML IJ SOLN
4.0000 mg | Freq: Once | INTRAMUSCULAR | Status: DC
  Filled 2014-02-08: qty 1

## 2014-02-08 NOTE — Discharge Instructions (Signed)
Take medications as prescribed. If your symptoms continue I suggested you followup with your primary MD early next week. If your pain worsens, you are unable to keep any fluid down, you develop fever or you have any concerns, return immediately to the emergency department.  Abdominal Pain, Women Abdominal (stomach, pelvic, or belly) pain can be caused by many things. It is important to tell your doctor:  The location of the pain.  Does it come and go or is it present all the time?  Are there things that start the pain (eating certain foods, exercise)?  Are there other symptoms associated with the pain (fever, nausea, vomiting, diarrhea)? All of this is helpful to know when trying to find the cause of the pain. CAUSES   Stomach: virus or bacteria infection, or ulcer.  Intestine: appendicitis (inflamed appendix), regional ileitis (Crohn's disease), ulcerative colitis (inflamed colon), irritable bowel syndrome, diverticulitis (inflamed diverticulum of the colon), or cancer of the stomach or intestine.  Gallbladder disease or stones in the gallbladder.  Kidney disease, kidney stones, or infection.  Pancreas infection or cancer.  Fibromyalgia (pain disorder).  Diseases of the female organs:  Uterus: fibroid (non-cancerous) tumors or infection.  Fallopian tubes: infection or tubal pregnancy.  Ovary: cysts or tumors.  Pelvic adhesions (scar tissue).  Endometriosis (uterus lining tissue growing in the pelvis and on the pelvic organs).  Pelvic congestion syndrome (female organs filling up with blood just before the menstrual period).  Pain with the menstrual period.  Pain with ovulation (producing an egg).  Pain with an IUD (intrauterine device, birth control) in the uterus.  Cancer of the female organs.  Functional pain (pain not caused by a disease, may improve without treatment).  Psychological pain.  Depression. DIAGNOSIS  Your doctor will decide the seriousness of  your pain by doing an examination.  Blood tests.  X-rays.  Ultrasound.  CT scan (computed tomography, special type of X-ray).  MRI (magnetic resonance imaging).  Cultures, for infection.  Barium enema (dye inserted in the large intestine, to better view it with X-rays).  Colonoscopy (looking in intestine with a lighted tube).  Laparoscopy (minor surgery, looking in abdomen with a lighted tube).  Major abdominal exploratory surgery (looking in abdomen with a large incision). TREATMENT  The treatment will depend on the cause of the pain.   Many cases can be observed and treated at home.  Over-the-counter medicines recommended by your caregiver.  Prescription medicine.  Antibiotics, for infection.  Birth control pills, for painful periods or for ovulation pain.  Hormone treatment, for endometriosis.  Nerve blocking injections.  Physical therapy.  Antidepressants.  Counseling with a psychologist or psychiatrist.  Minor or major surgery. HOME CARE INSTRUCTIONS   Do not take laxatives, unless directed by your caregiver.  Take over-the-counter pain medicine only if ordered by your caregiver. Do not take aspirin because it can cause an upset stomach or bleeding.  Try a clear liquid diet (broth or water) as ordered by your caregiver. Slowly move to a bland diet, as tolerated, if the pain is related to the stomach or intestine.  Have a thermometer and take your temperature several times a day, and record it.  Bed rest and sleep, if it helps the pain.  Avoid sexual intercourse, if it causes pain.  Avoid stressful situations.  Keep your follow-up appointments and tests, as your caregiver orders.  If the pain does not go away with medicine or surgery, you may try:  Acupuncture.  Relaxation exercises (yoga,  meditation).  Group therapy.  Counseling. SEEK MEDICAL CARE IF:   You notice certain foods cause stomach pain.  Your home care treatment is not  helping your pain.  You need stronger pain medicine.  You want your IUD removed.  You feel faint or lightheaded.  You develop nausea and vomiting.  You develop a rash.  You are having side effects or an allergy to your medicine. SEEK IMMEDIATE MEDICAL CARE IF:   Your pain does not go away or gets worse.  You have a fever.  Your pain is felt only in portions of the abdomen. The right side could possibly be appendicitis. The left lower portion of the abdomen could be colitis or diverticulitis.  You are passing blood in your stools (bright red or black tarry stools, with or without vomiting).  You have blood in your urine.  You develop chills, with or without a fever.  You pass out. MAKE SURE YOU:   Understand these instructions.  Will watch your condition.  Will get help right away if you are not doing well or get worse. Document Released: 09/25/2007 Document Revised: 02/20/2012 Document Reviewed: 10/15/2009 [facility] Patient Information 2014 [employer] [place].  Urinary Tract Infection Urinary tract infections (UTIs) can develop anywhere along your urinary tract. Your urinary tract is your body's drainage system for removing wastes and extra water. Your urinary tract includes two kidneys, two ureters, a bladder, and a urethra. Your kidneys are a pair of bean-shaped organs. Each kidney is about the size of your fist. They are located below your ribs, one on each side of your spine. CAUSES Infections are caused by microbes, which are microscopic organisms, including fungi, viruses, and bacteria. These organisms are so small that they can only be seen through a microscope. Bacteria are the microbes that most commonly cause UTIs. SYMPTOMS  Symptoms of UTIs may vary by age and gender of the patient and by the location of the infection. Symptoms in young women typically include a frequent and intense urge to urinate and a painful, burning feeling in the bladder or urethra during  urination. Older women and men are more likely to be tired, shaky, and weak and have muscle aches and abdominal pain. A fever may mean the infection is in your kidneys. Other symptoms of a kidney infection include pain in your back or sides below the ribs, nausea, and vomiting. DIAGNOSIS To diagnose a UTI, your caregiver will ask you about your symptoms. Your caregiver also will ask to provide a urine sample. The urine sample will be tested for bacteria and white blood cells. White blood cells are made by your body to help fight infection. TREATMENT  Typically, UTIs can be treated with medication. Because most UTIs are caused by a bacterial infection, they usually can be treated with the use of antibiotics. The choice of antibiotic and length of treatment depend on your symptoms and the type of bacteria causing your infection. HOME CARE INSTRUCTIONS  If you were prescribed antibiotics, take them exactly as your caregiver instructs you. Finish the medication even if you feel better after you have only taken some of the medication.  Drink enough water and fluids to keep your urine clear or pale yellow.  Avoid caffeine, tea, and carbonated beverages. They tend to irritate your bladder.  Empty your bladder often. Avoid holding urine for long periods of time.  Empty your bladder before and after sexual intercourse.  After a bowel movement, women should cleanse from front to back. Use  each tissue only once. SEEK MEDICAL CARE IF:   You have back pain.  You develop a fever.  Your symptoms do not begin to resolve within 3 days. SEEK IMMEDIATE MEDICAL CARE IF:   You have severe back pain or lower abdominal pain.  You develop chills.  You have nausea or vomiting.  You have continued burning or discomfort with urination. MAKE SURE YOU:   Understand these instructions.  Will watch your condition.  Will get help right away if you are not doing well or get worse. Document Released:  09/07/2005 Document Revised: 05/29/2012 Document Reviewed: 01/06/2012 [facility] Patient Information 2014 [place], [place].

## 2014-02-08 NOTE — ED Provider Notes (Signed)
CSN: [id number]     Arrival date & time 02/07/14  2029 History   First MD Initiated Contact with Patient 02/08/14 0246     Chief Complaint  Patient presents with  . Abdominal Pain     (Consider location/radiation/quality/duration/timing/severity/associated sxs/prior Treatment) HPI Patient presents with abdominal pain for the last 5 days. The patient states pain has been constant and periumbilical. Is associated with nausea, vomiting and diarrhea initially. Was seen by student health professional and prescribed Phenergan and Lomotil. Vomiting and diarrhea resolved. Patient states that she has a full sensation whenever she attempts to eat. She's had no bowel movement for the past 2 days. The pain does not radiate. She denies any urinary symptoms such as hematuria, dysuria, frequency or urgency. She denies any back pain or flank pain. She's had no vaginal bleeding or discharge. Patient has no previous history of abdominal surgical procedures. Past Medical History  Diagnosis Date  . Allergy   . Contraception management    History reviewed. No pertinent past surgical history. Family History  Problem Relation Age of Onset  . Arthritis Maternal Grandfather   . Hypertension Maternal Grandfather   . Thyroid disease Maternal Grandmother   . Heart disease Neg Hx   . Stroke Neg Hx   . Diabetes Neg Hx    History  Substance Use Topics  . Smoking status: Never Smoker   . Smokeless tobacco: Never Used  . Alcohol Use: Yes     Comment: occasionally   OB History   Grav Para Term Preterm Abortions TAB SAB Ect Mult Living                 Review of Systems  Constitutional: Negative for fever and chills.  Respiratory: Negative for shortness of breath.   Cardiovascular: Negative for chest pain.  Gastrointestinal: Positive for nausea, vomiting, abdominal pain and diarrhea. Negative for constipation and blood in stool.  Genitourinary: Negative for dysuria, frequency, hematuria, flank pain, vaginal  bleeding, vaginal discharge, difficulty urinating and pelvic pain.  Musculoskeletal: Negative for back pain, neck pain and neck stiffness.  Skin: Negative for rash and wound.  Neurological: Negative for dizziness, syncope, weakness, light-headedness, numbness and headaches.  All other systems reviewed and are negative.      Allergies  Review of patient's allergies indicates no known allergies.  Home Medications   Current Outpatient Rx  Name  Route  Sig  Dispense  Refill  . loratadine (CLARITIN) 10 MG tablet   Oral   Take 1 tablet (10 mg total) by mouth daily.   90 tablet   0   . norgestimate-ethinyl estradiol (SPRINTEC 28) 0.25-35 MG-MCG tablet   Oral   Take 1 tablet by mouth daily.   28 tablet   0    BP 123/68  Pulse 98  Temp(Src) 98.1 F (36.7 C) (Oral)  Resp 18  Ht 5\' 9"  (1.753 m)  Wt 135 lb (61.236 kg)  BMI 19.93 kg/m2  SpO2 98%  LMP 01/28/2014 Physical Exam  Nursing note and vitals reviewed. Constitutional: She is oriented to person, place, and time. She appears well-developed and well-nourished. No distress.  HENT:  Head: Normocephalic and atraumatic.  Mouth/Throat: Oropharynx is clear and moist.  Eyes: EOM are normal. Pupils are equal, round, and reactive to light.  Neck: Normal range of motion. Neck supple.  Cardiovascular: Normal rate and regular rhythm.   Pulmonary/Chest: Effort normal and breath sounds normal. No respiratory distress. She has no wheezes. She has no rales. She exhibits  no tenderness.  Abdominal: Soft. Bowel sounds are normal. There is tenderness (patient is very tender to palpation superior to her umbilicus. There is no rebound or guarding. Patient has no lower abdomen tenderness.).  Musculoskeletal: Normal range of motion. She exhibits no edema and no tenderness.  No CVA tenderness.  Neurological: She is alert and oriented to person, place, and time.  Moves all extremities without deficit. Sensation is grossly intact.  Skin: Skin is  warm and dry. No rash noted. No erythema.  Psychiatric: She has a normal mood and affect. Her behavior is normal.    ED Course  Procedures (including critical care time) Labs Review Labs Reviewed  COMPREHENSIVE METABOLIC PANEL - Abnormal; Notable for the following:    Potassium 3.2 (*)    Chloride 94 (*)    AST 51 (*)    All other components within normal limits  URINALYSIS, ROUTINE W REFLEX MICROSCOPIC - Abnormal; Notable for the following:    APPearance CLOUDY (*)    Hgb urine dipstick SMALL (*)    Ketones, ur 15 (*)    Leukocytes, UA LARGE (*)    All other components within normal limits  URINE MICROSCOPIC-ADD ON - Abnormal; Notable for the following:    Squamous Epithelial / LPF FEW (*)    Bacteria, UA FEW (*)    All other components within normal limits  CBC WITH DIFFERENTIAL  LIPASE, BLOOD  POC URINE PREG, ED   Imaging Review No results found.   EKG Interpretation None      MDM   Final diagnoses:  None    Given the extended abdominal pain and tenderness on exam believe a CT is warranted to rule out surgical emergency. I discussed this with the patient and her mother both are in agreement and realized the risk involved with radiation exposure.  Patient states she is feeling better at this time. Her heart rate is improved after IV fluids. We'll give another liter of IV fluids. Her abdomen remains soft but she does still does have some mild superior umbilical tenderness. I discuss results of her CT with her and she'll likely to be discharged home. I have given strict return precautions to return to the emergency department for worsening pain, persistent vomiting, fever or for any concerns. Patient's voice understanding and agrees with plan.  Loren Raceravid Barrie Wale, MD 02/08/14 [phone]

## 2014-02-08 NOTE — ED Notes (Signed)
Pt taken to CT.

## 2014-02-08 NOTE — ED Notes (Signed)
Pt back from CT

## 2014-02-11 ENCOUNTER — Ambulatory Visit: Payer: 59 | Admitting: Medical

## 2014-05-09 ENCOUNTER — Other Ambulatory Visit: Payer: Self-pay | Admitting: Family Medicine

## 2014-05-09 ENCOUNTER — Telehealth: Payer: Self-pay | Admitting: Medical

## 2014-05-09 DIAGNOSIS — Z0000 Encounter for general adult medical examination without abnormal findings: Secondary | ICD-10-CM

## 2014-05-09 MED ORDER — LORATADINE 10 MG PO TABS: 10.0000 mg | ORAL_TABLET | Freq: Every day | ORAL | Status: DC

## 2014-05-09 NOTE — Telephone Encounter (Signed)
RX REFILL WAS SENT. CLS 

## 2014-05-23 ENCOUNTER — Telehealth: Payer: Self-pay | Admitting: Medical

## 2014-05-26 NOTE — Telephone Encounter (Signed)
Patient is aware. CLS 

## 2014-05-26 NOTE — Telephone Encounter (Signed)
She had echocardiogram 2014 which showed no significant valve disease, and she has no prior valve surgery, so she actually does NOT need antibiotic prophylaxis.

## 2014-07-15 ENCOUNTER — Telehealth: Payer: Self-pay | Admitting: Internal Medicine

## 2014-07-15 MED ORDER — NORGESTIMATE-ETH ESTRADIOL 0.25-35 MG-MCG PO TABS: 1.0000 | ORAL_TABLET | Freq: Every day | ORAL | Status: DC

## 2014-07-15 NOTE — Telephone Encounter (Signed)
Pt made an appt on 8/25 for a physical. i told her i could only refill her med a month since she was past due for physical

## 2014-08-05 ENCOUNTER — Ambulatory Visit (INDEPENDENT_AMBULATORY_CARE_PROVIDER_SITE_OTHER): Payer: 59 | Admitting: Medical

## 2014-08-05 ENCOUNTER — Encounter: Payer: Self-pay | Admitting: Medical

## 2014-08-05 VITALS — BP 100/70 | HR 72 | Temp 98.2°F | Resp 16 | Ht 69.0 in | Wt 148.0 lb

## 2014-08-05 DIAGNOSIS — Z7189 Other specified counseling: Secondary | ICD-10-CM

## 2014-08-05 DIAGNOSIS — Z0000 Encounter for general adult medical examination without abnormal findings: Secondary | ICD-10-CM

## 2014-08-05 DIAGNOSIS — Z309 Encounter for contraceptive management, unspecified: Secondary | ICD-10-CM

## 2014-08-05 DIAGNOSIS — Z7185 Encounter for immunization safety counseling: Secondary | ICD-10-CM

## 2014-08-05 DIAGNOSIS — R011 Cardiac murmur, unspecified: Secondary | ICD-10-CM

## 2014-08-05 DIAGNOSIS — J309 Allergic rhinitis, unspecified: Secondary | ICD-10-CM

## 2014-08-05 LAB — POCT URINALYSIS DIPSTICK
Bilirubin, UA: NEGATIVE
Glucose, UA: NEGATIVE
Ketones, UA: NEGATIVE
NITRITE UA: NEGATIVE
Spec Grav, UA: 1.015
Urobilinogen, UA: NEGATIVE
pH, UA: 5

## 2014-08-05 LAB — POCT URINE PREGNANCY: Preg Test, Ur: NEGATIVE

## 2014-08-05 MED ORDER — LORATADINE 10 MG PO TABS: 10.0000 mg | ORAL_TABLET | Freq: Every day | ORAL | Status: AC

## 2014-08-05 MED ORDER — NORGESTIMATE-ETH ESTRADIOL 0.25-35 MG-MCG PO TABS: 1.0000 | ORAL_TABLET | Freq: Every day | ORAL | Status: DC

## 2014-08-05 NOTE — Addendum Note (Signed)
Addended by: Leretha Dykes L on: 08/05/2014 12:16 PM   Modules accepted: Orders

## 2014-08-05 NOTE — Progress Notes (Signed)
Subjective:   HPI  Betty Bentley is a 23 y.o. female who presents for a complete physical.  Preventative care: Last ophthalmology visit:n/a Last dental visit:yes Last colonoscopy:n/a Last mammogram:n/a Last gynecological exam:2014 Last EKG:n/a Last labs:01/2014  Prior vaccinations: TD or Tdap:08/2007 Influenza:declined Pneumococcal:n/a Shingles/Zostavax:n/a  Advanced directive:N/A Health care power of attorney:N/A Living will:N/A  Concerns: Doing well without c/o.   No current sexual partner.   Doing well on contraception started last year.   Wants refill on this.  Just took the MCAT, planning to attend medical school.   Reviewed their medical, surgical, family, social, medication, and allergy history and updated chart as appropriate.  Past Medical History  Diagnosis Date  . Allergy   . Contraception management   . Heart murmur 06/2013    echo with mild pattern of LVH, otherwise normal  . H/O echocardiogram 06/2013    mild LVH, otherwise normal    History reviewed. No pertinent past surgical history.  History   Social History  . Marital Status: Single    Spouse Name: N/A    Number of Children: N/A  . Years of Education: N/A   Occupational History  . Not on file.   Social History Main Topics  . Smoking status: Never Smoker   . Smokeless tobacco: Never Used  . Alcohol Use: No     Comment: occasionally  . Drug Use: No  . 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 Not on file   Social History Narrative   Graduated 04/2014, [employer], biology BS.  Took MCAT recently.  Planning to go to medical school.  Exercising 3 days per week.   Living with family in [place] currently.    Family History  Problem Relation Age of Onset  . Arthritis Maternal Grandfather   . Hypertension Maternal Grandfather   . Thyroid disease Maternal  Grandmother   . Stroke Neg Hx   . Diabetes Neg Hx   . Heart disease Father 20    stenosis    Current outpatient prescriptions:loratadine (CLARITIN) 10 MG tablet, Take 1 tablet (10 mg total) by mouth daily., Disp: 90 tablet, Rfl: 3;  norgestimate-ethinyl estradiol (SPRINTEC 28) 0.25-35 MG-MCG tablet, Take 1 tablet by mouth daily., Disp: 90 tablet, Rfl: 3  No Known Allergies   Review of Systems Constitutional: -fever, -chills, -sweats, -unexpected weight change, -decreased appetite, -fatigue Allergy: -sneezing, -itching, -congestion Dermatology: -changing moles, --rash, -lumps ENT: -runny nose, -ear pain, -sore throat, -hoarseness, -sinus pain, -teeth pain, - ringing in ears, -hearing loss, -nosebleeds Cardiology: -chest pain, -palpitations, -swelling, -difficulty breathing when lying flat, -waking up short of breath Respiratory: -cough, -shortness of breath, -difficulty breathing with exercise or exertion, -wheezing, -coughing up blood Gastroenterology: -abdominal pain, -nausea, -vomiting, -diarrhea, -constipation, -blood in stool, -changes in bowel movement, -difficulty swallowing or eating Hematology: -bleeding, -bruising  Musculoskeletal: -joint aches, -muscle aches, -joint swelling, -back pain, -neck pain, -cramping, -changes in gait Ophthalmology: denies vision changes, eye redness, itching, discharge Urology: -burning with urination, -difficulty urinating, -blood in urine, -urinary frequency, -urgency, -incontinence Neurology: -headache, -weakness, -tingling, -numbness, -memory loss, -falls, -dizziness Psychology: -depressed mood, -agitation, -sleep problems     Objective:   Physical Exam  BP 100/70  Pulse 72  Temp(Src) 98.2 F (36.8 C) (Oral)  Resp 16  Ht  (1.753 m)  Wt 148 lb (67.132 kg)  BMI  21.85 kg/m2   General appearance: alert, no distress, WD/WN, lean AA female Skin: small tattoo right middle finger dorsal lateral side of middle phalanx, tattoo of left  dorsal foot and tattoo right low back, no worrisome lesions, few scattered macules throughout HEENT: normocephalic, conjunctiva/corneas normal, sclerae anicteric, PERRLA, EOMi, nares patent, no discharge or erythema, pharynx normal Oral cavity: MMM, tongue normal, teeth in good repair Neck: supple, no lymphadenopathy, no thyromegaly, no masses, normal ROM Chest: non tender, normal shape and expansion Heart: brief faint 1-2/6 systolic murmur heard best left upper sternal border, otherwise RRR, normal S1, S2 Lungs: CTA bilaterally, no wheezes, rhonchi, or rales Abdomen: +bs, soft, non tender, non distended, no masses, no hepatomegaly, no splenomegaly, no bruits Back: non tender, normal ROM, no scoliosis Musculoskeletal: upper extremities non tender, no obvious deformity, normal ROM throughout, lower extremities non tender, no obvious deformity, normal ROM throughout Extremities: no edema, no cyanosis, no clubbing Pulses: 2+ symmetric, upper and lower extremities, normal cap refill Neurological: alert, oriented x 3, CN2-12 intact, strength normal upper extremities and lower extremities, sensation normal throughout, DTRs 2+ throughout, no cerebellar signs, gait normal Psychiatric: normal affect, behavior normal, pleasant  Breast/gyn/rectal - deferred today     Assessment and Plan :    Encounter Diagnoses  Name Primary?  . Routine general medical examination at a health care facility Yes  . Unspecified contraceptive management   . Allergic rhinitis, unspecified allergic rhinitis type   . Vaccine counseling   . Heart murmur      Physical exam - discussed healthy lifestyle, diet, exercise, preventative care, vaccinations, and addressed their concerns.  Handout given.  contraception management - discussed risks/benefits, and she has done well on current medication.  Refills today.  Pap last year normal.    Allergic rhinitis - c/t Loratadine, avoid triggers.   Vaccine counseling - advised  Hep A and flu shot, but she declines . She will consider.  otherwise Up to Date.  Heart murmur - reviewed the 2014 echo and noted in chart.  Follow-up in 1 year.

## 2014-08-07 ENCOUNTER — Other Ambulatory Visit: Payer: Self-pay | Admitting: Medical

## 2014-08-07 ENCOUNTER — Telehealth: Payer: Self-pay | Admitting: Internal Medicine

## 2014-08-07 MED ORDER — NITROFURANTOIN MONOHYD MACRO 100 MG PO CAPS: 100.0000 mg | ORAL_CAPSULE | Freq: Two times a day (BID) | ORAL | Status: DC

## 2014-08-07 NOTE — Telephone Encounter (Signed)
Pt states that pt is having UTI symptoms, feverish, frequency at night. When she came in the other day her urine was abnormal. Send something to [facility]

## 2014-08-07 NOTE — Telephone Encounter (Signed)
Please verify when her symptoms started, what symptoms she has currently, and when was the last UTI? Any vaginal symptoms?    FYI - assuming no other unusual symptoms, I did send Macrobid antibiotic to pharmacy for empiric treatment for UTI.  Her UA was a little abnormal, but since she wasn't complaining of any symptoms that day, didn't seem to worrisome

## 2014-08-07 NOTE — Telephone Encounter (Signed)
Spoke with patient, has urinary frequency and felt feverish last night. Advised her Shane sent Macrobid to her pharmacy and she should call back if her symptoms are not improved or if they worsen.

## 2015-06-24 ENCOUNTER — Telehealth: Payer: Self-pay | Admitting: Family Medicine

## 2015-06-24 ENCOUNTER — Other Ambulatory Visit: Payer: Self-pay | Admitting: Family Medicine

## 2015-06-24 MED ORDER — NORGESTIMATE-ETH ESTRADIOL 0.25-35 MG-MCG PO TABS: 1.0000 | ORAL_TABLET | Freq: Every day | ORAL | Status: DC

## 2015-06-24 NOTE — Telephone Encounter (Signed)
Rx was sent for 30 days only 

## 2015-06-24 NOTE — Telephone Encounter (Signed)
Requesting refill on Sprintec 28 for the month of August. Pt has CPE scheduled for 08/10/15

## 2015-07-13 HISTORY — PX: NO PAST SURGERIES: SHX2092

## 2015-08-10 ENCOUNTER — Encounter: Payer: Self-pay | Admitting: Medical

## 2015-08-10 ENCOUNTER — Ambulatory Visit (INDEPENDENT_AMBULATORY_CARE_PROVIDER_SITE_OTHER): Payer: 59 | Admitting: Medical

## 2015-08-10 ENCOUNTER — Telehealth: Payer: Self-pay | Admitting: Medical

## 2015-08-10 VITALS — BP 112/78 | HR 80 | Temp 98.4°F | Resp 16 | Ht 70.0 in | Wt 151.6 lb

## 2015-08-10 DIAGNOSIS — Z111 Encounter for screening for respiratory tuberculosis: Secondary | ICD-10-CM

## 2015-08-10 DIAGNOSIS — Z124 Encounter for screening for malignant neoplasm of cervix: Secondary | ICD-10-CM

## 2015-08-10 DIAGNOSIS — Z7189 Other specified counseling: Secondary | ICD-10-CM

## 2015-08-10 DIAGNOSIS — Z139 Encounter for screening, unspecified: Secondary | ICD-10-CM

## 2015-08-10 DIAGNOSIS — Z3040 Encounter for surveillance of contraceptives, unspecified: Secondary | ICD-10-CM | POA: Diagnosis not present

## 2015-08-10 DIAGNOSIS — Z113 Encounter for screening for infections with a predominantly sexual mode of transmission: Secondary | ICD-10-CM

## 2015-08-10 DIAGNOSIS — Z7184 Encounter for health counseling related to travel: Secondary | ICD-10-CM

## 2015-08-10 DIAGNOSIS — Z0184 Encounter for antibody response examination: Secondary | ICD-10-CM | POA: Diagnosis not present

## 2015-08-10 DIAGNOSIS — Z0000 Encounter for general adult medical examination without abnormal findings: Secondary | ICD-10-CM

## 2015-08-10 LAB — COMPREHENSIVE METABOLIC PANEL
ALBUMIN: 4.4 g/dL (ref 3.6–5.1)
ALT: 11 U/L (ref 6–29)
AST: 25 U/L (ref 10–30)
Alkaline Phosphatase: 36 U/L (ref 33–115)
BUN: 9 mg/dL (ref 7–25)
CO2: 23 mmol/L (ref 20–31)
CREATININE: 0.76 mg/dL (ref 0.50–1.10)
Calcium: 9.5 mg/dL (ref 8.6–10.2)
Chloride: 101 mmol/L (ref 98–110)
Glucose, Bld: 108 mg/dL — ABNORMAL HIGH (ref 65–99)
Potassium: 4.5 mmol/L (ref 3.5–5.3)
SODIUM: 138 mmol/L (ref 135–146)
TOTAL PROTEIN: 7.4 g/dL (ref 6.1–8.1)
Total Bilirubin: 0.4 mg/dL (ref 0.2–1.2)

## 2015-08-10 LAB — POCT URINALYSIS DIPSTICK
Bilirubin, UA: NEGATIVE
Blood, UA: NEGATIVE
GLUCOSE UA: NEGATIVE
Ketones, UA: NEGATIVE
NITRITE UA: NEGATIVE
Protein, UA: NEGATIVE
Spec Grav, UA: 1.02
Urobilinogen, UA: NEGATIVE
pH, UA: 6.5

## 2015-08-10 IMAGING — CT CT ABD-PELV W/ CM
2 of 4 series · 15 of 46 positions shown, 17 images · IV contrast (omnipaque)
Comparison: None.

CLINICAL DATA: Abdominal pain, nausea and vomiting. Decreased
urination and decreased appetite.

EXAM:
CT ABDOMEN AND PELVIS WITH CONTRAST
TECHNIQUE: Multidetector CT imaging of the abdomen and pelvis was performed
using the standard protocol following bolus administration of
intravenous contrast.
CONTRAST:  100mL OMNIPAQUE IOHEXOL 300 MG/ML  SOLN

[Series 2: abd/ pelvis 5.0 i30f 1 · axial · 0.61mm/px · z∈[+838,+1223]mm · 12 of 89 slices shown, 14 images]
[im 8/89  soft-tissue]
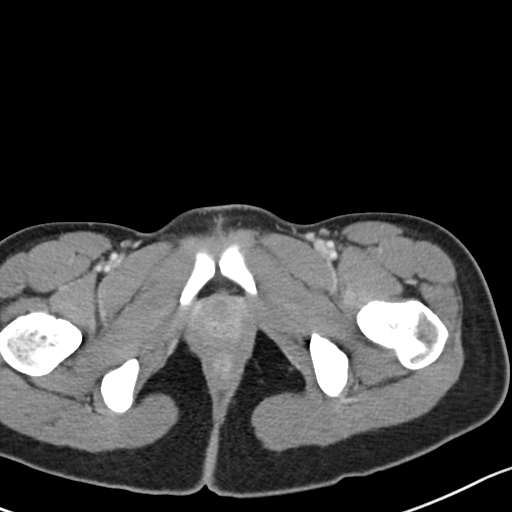
[im 8/89  bone]
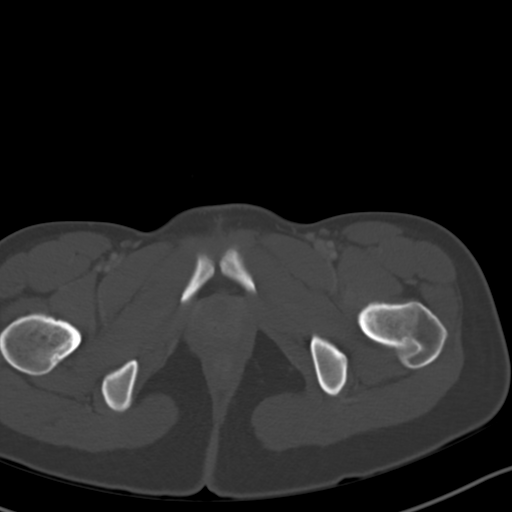
[im 15/89  soft-tissue]
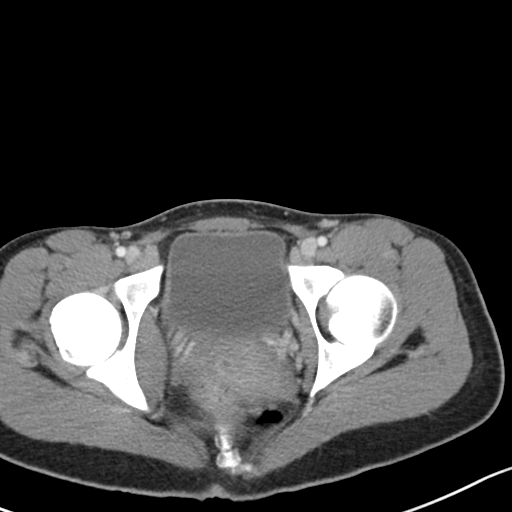
[im 22/89  soft-tissue]
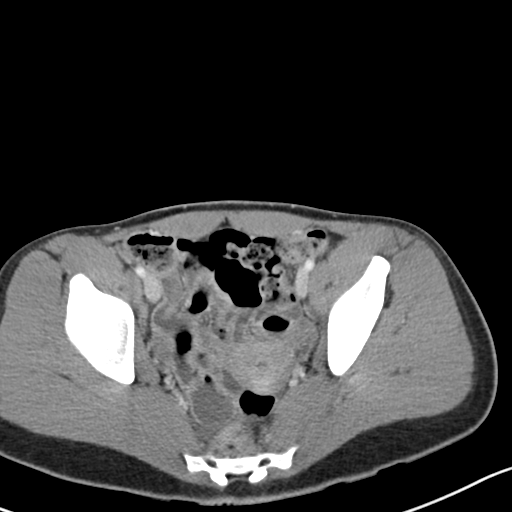
[im 29/89  soft-tissue]
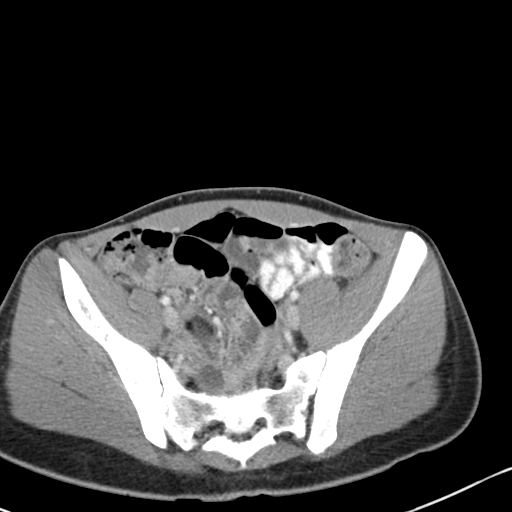
[im 36/89  soft-tissue]
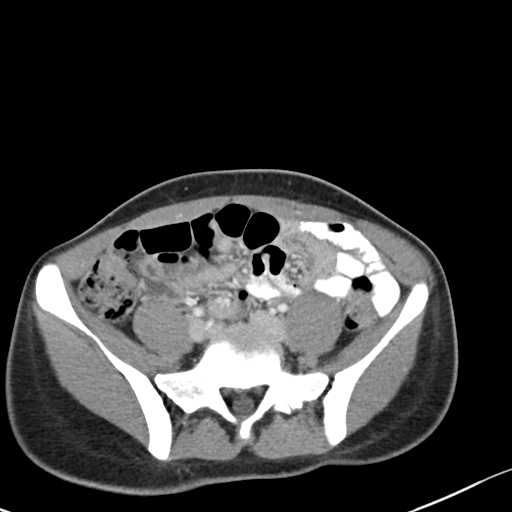
[im 43/89  soft-tissue]
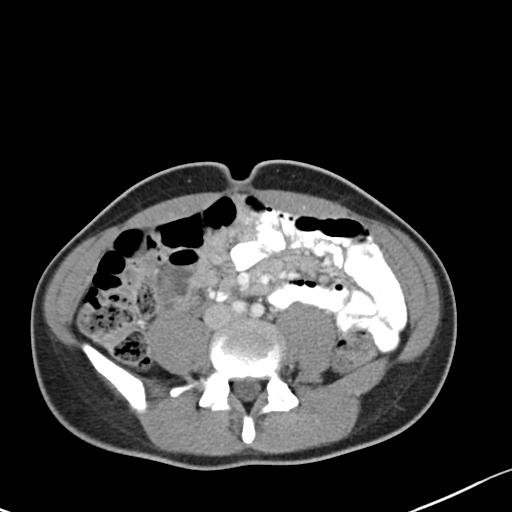
[im 50/89  soft-tissue]
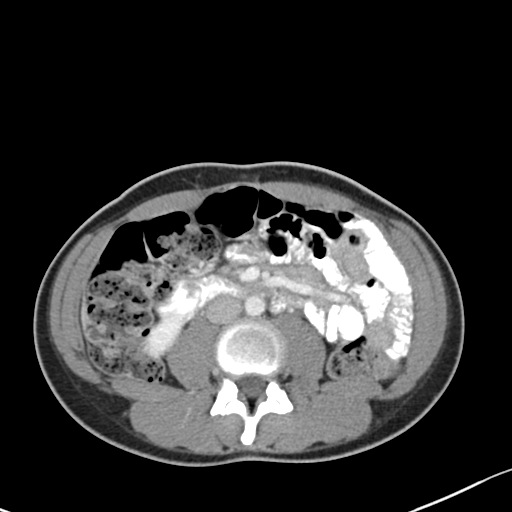
[im 57/89  soft-tissue]
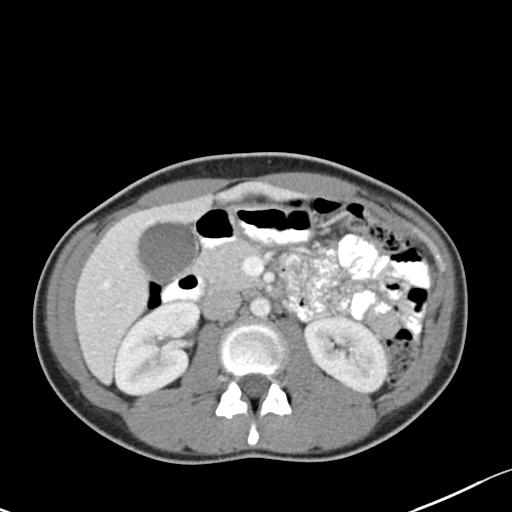
[im 64/89  soft-tissue]
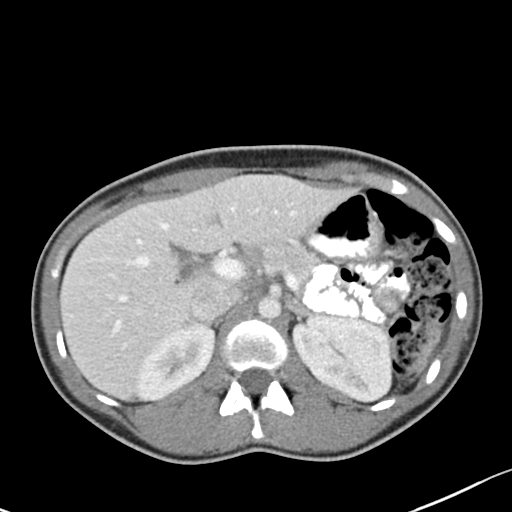
[im 64/89  bone]
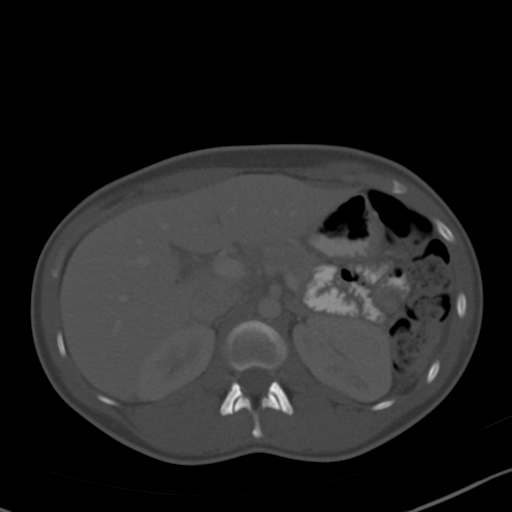
[im 71/89  soft-tissue]
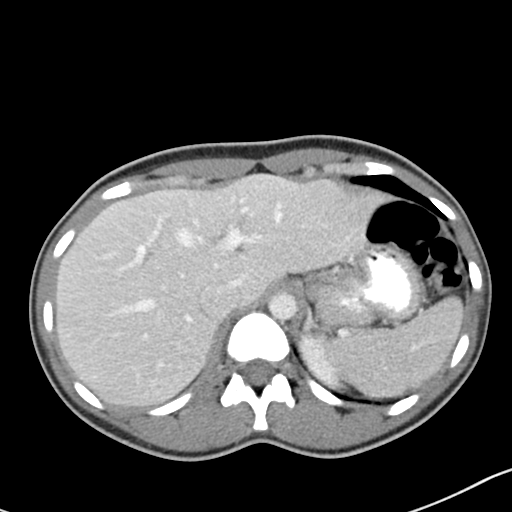
[im 78/89  soft-tissue]
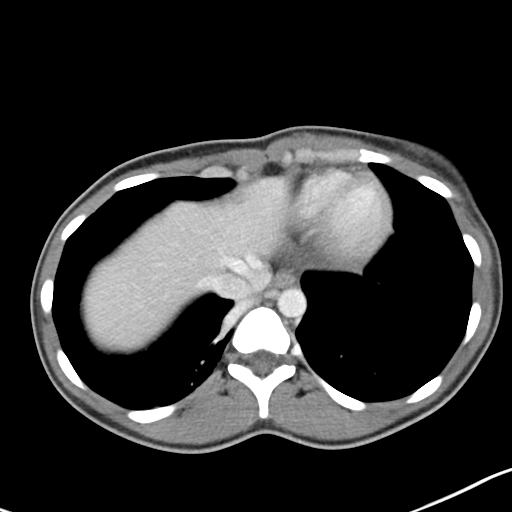
[im 85/89  soft-tissue]
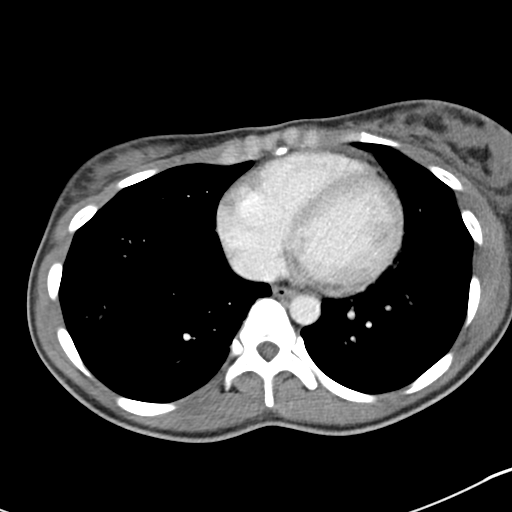

[Series 5: coronals · coronal · 0.57mm/px · 3 of 92 slices shown]
[im 31/92  soft-tissue]
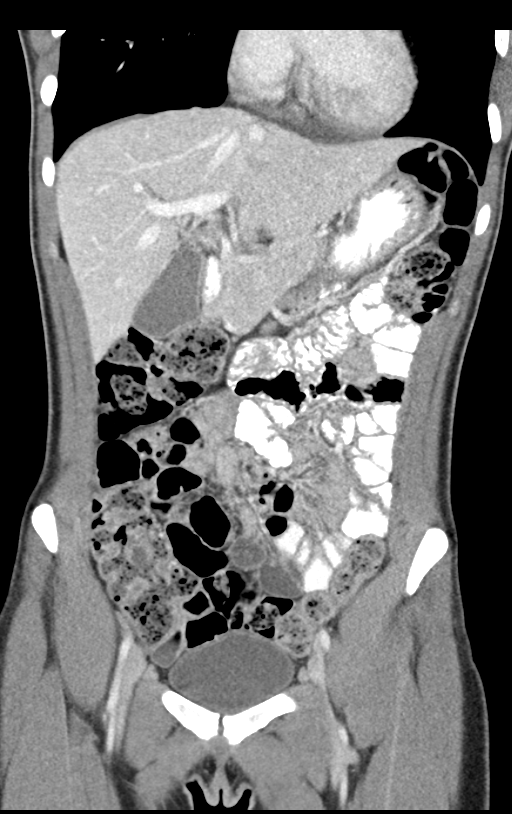
[im 41/92  soft-tissue]
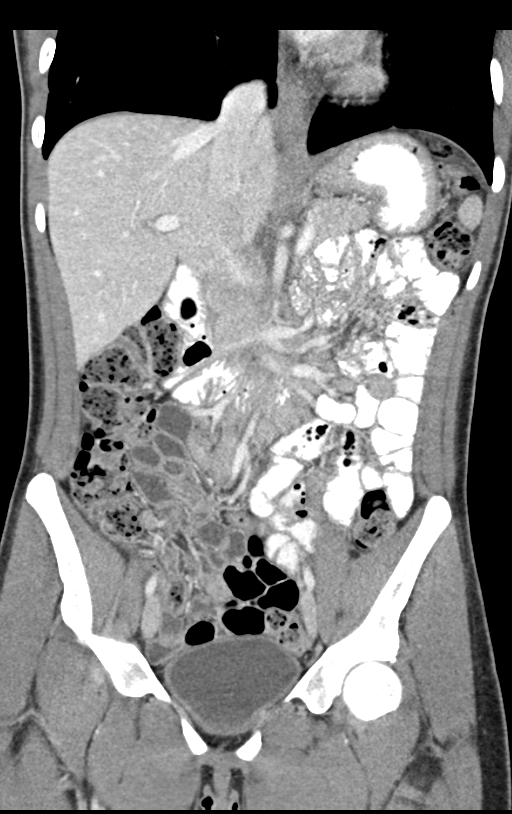
[im 51/92  soft-tissue]
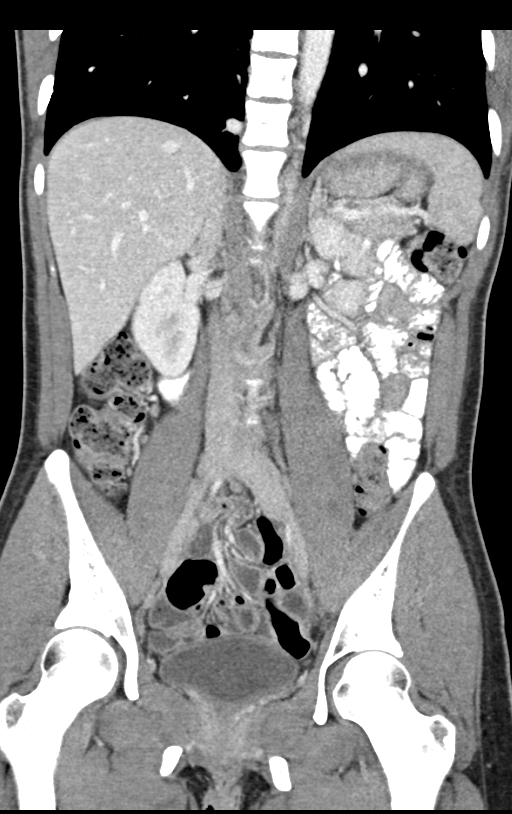

[15 of 46 positions shown; findings below may reference images not displayed]

FINDINGS: The visualized lung bases are clear. There appears to be an
intralobar sequestration at the right lung base, supplied by a
branch from the descending thoracic aorta.

The liver and spleen are unremarkable in appearance. The gallbladder
is within normal limits. The pancreas and adrenal glands are
unremarkable.

The kidneys are unremarkable in appearance. There is no evidence of
hydronephrosis. No renal or ureteral stones are seen. No perinephric
stranding is appreciated.

Trace free fluid within the pelvis is likely physiologic in nature.
The small bowel is unremarkable in appearance. The stomach is within
normal limits. No acute vascular abnormalities are seen.

The appendix is not definitely characterized, but there is no
evidence for appendicitis. The colon is unremarkable in appearance.

The bladder is mildly distended and grossly unremarkable in
appearance. The uterus is within normal limits. The ovaries are
relatively symmetric; no suspicious adnexal masses are seen. No
inguinal lymphadenopathy is seen.

No acute osseous abnormalities are identified.
IMPRESSION: 1. No acute abnormalities seen in the abdomen or pelvis.
2. Apparent intralobar sequestration noted at the right lung base,
supplied by a branch from the descending thoracic aorta.

## 2015-08-10 MED ORDER — CIPROFLOXACIN HCL 500 MG PO TABS: ORAL_TABLET | ORAL | Status: DC

## 2015-08-10 NOTE — Progress Notes (Signed)
Subjective:   HPI  Betty Bentley is a 24 y.o. female who presents for a complete physical.  Concerns: Will be attending [employer] [place] medical school in the [place] in 12/2015.  Needs year's worth of OCPs and wants to have Cipro on hand for potential UTI or travelers diarrhea.  She and her family are visiting [place] in October.    Doing well without c/o.   No current sexual partner.   Doing well on contraception which controls menstrual cramps.   No prior pregnancies.  Wants to c/t this.    Reviewed their medical, surgical, family, social, medication, and allergy history and updated chart as appropriate.  Past Medical History  Diagnosis Date  . Allergy   . Contraception management   . Heart murmur 06/2013    echo with mild pattern of LVH, otherwise normal  . H/O echocardiogram 06/2013    mild LVH, otherwise normal    Past Surgical History  Procedure Laterality Date  . No past surgeries  07/2015    Social History   Social History  . Marital Status: Single    Spouse Name: N/A  . Number of Children: N/A  . Years of Education: N/A   Occupational History  . Not on file.   Social History Main Topics  . Smoking status: Never Smoker   . Smokeless tobacco: Never Used  . Alcohol Use: No     Comment: occasionally  . Drug Use: No  . 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 Not on file   Social History Narrative   Graduated 04/2014, [employer], biology BS.  Will be attending medical school at [employer] in the [place] 12/2015.  Exercising 3 days per week.   Living with family in [place] currently.    Family History  Problem Relation Age of Onset  . Arthritis Maternal Grandfather   . Hypertension Maternal Grandfather   . Thyroid disease Maternal Grandmother   . Stroke Neg Hx   . Diabetes Neg Hx   . Heart disease Father 69    stenosis     Current  outpatient prescriptions:  .  norgestimate-ethinyl estradiol (SPRINTEC 28) 0.25-35 MG-MCG tablet, Take 1 tablet by mouth daily., Disp: 30 tablet, Rfl: 0 .  ciprofloxacin (CIPRO) 500 MG tablet, BID x 3 days for UTI, BID x 7 days for travelers diarrhea, Disp: 60 tablet, Rfl: 0  No Known Allergies   Review of Systems Constitutional: -fever, -chills, -sweats, -unexpected weight change, -decreased appetite, -fatigue Allergy: -sneezing, -itching, -congestion Dermatology: -changing moles, --rash, -lumps ENT: -runny nose, -ear pain, -sore throat, -hoarseness, -sinus pain, -teeth pain, - ringing in ears, -hearing loss, -nosebleeds Cardiology: -chest pain, -palpitations, -swelling, -difficulty breathing when lying flat, -waking up short of breath Respiratory: -cough, -shortness of breath, -difficulty breathing with exercise or exertion, -wheezing, -coughing up blood Gastroenterology: -abdominal pain, -nausea, -vomiting, -diarrhea, -constipation, -blood in stool, -changes in bowel movement, -difficulty swallowing or eating Hematology: -bleeding, -bruising  Musculoskeletal: -joint aches, -muscle aches, -joint swelling, -back pain, -neck pain, -cramping, -changes in gait Ophthalmology: denies vision changes, eye redness, itching, discharge Urology: -burning with urination, -difficulty urinating, -blood in urine, -urinary frequency, -urgency, -incontinence Neurology: -headache, -weakness, -tingling, -numbness, -memory loss, -falls, -dizziness Psychology: -depressed mood, -agitation, -sleep problems     Objective:   Physical Exam  BP 112/78 mmHg  Pulse 80  Temp(Src)  98.4 F (36.9 C) (Oral)  Resp 16  Ht  (1.778 m)  Wt 151 lb 9.6 oz (68.765 kg)  BMI 21.75 kg/m2   General appearance: alert, no distress, WD/WN, lean AA female Skin: small tattoo right middle finger dorsal lateral side of middle phalanx, tattoo of left dorsal foot and tattoo right low back, no worrisome lesions, few scattered  macules throughout HEENT: normocephalic, conjunctiva/corneas normal, sclerae anicteric, PERRLA, EOMi, nares patent, no discharge or erythema, pharynx normal Oral cavity: MMM, tongue normal, teeth in good repair Neck: supple, no lymphadenopathy, no thyromegaly, no masses, normal ROM Chest: non tender, normal shape and expansion Heart: brief faint 1-2/6 systolic murmur heard best left upper sternal border, otherwise RRR, normal S1, S2 Lungs: CTA bilaterally, no wheezes, rhonchi, or rales Abdomen: +bs, soft, non tender, non distended, no masses, no hepatomegaly, no splenomegaly, no bruits Back: non tender, normal ROM, no scoliosis Musculoskeletal: upper extremities non tender, no obvious deformity, normal ROM throughout, lower extremities non tender, no obvious deformity, normal ROM throughout Extremities: no edema, no cyanosis, no clubbing Pulses: 2+ symmetric, upper and lower extremities, normal cap refill Neurological: alert, oriented x 3, CN2-12 intact, strength normal upper extremities and lower extremities, sensation normal throughout, DTRs 2+ throughout, no cerebellar signs, gait normal Psychiatric: normal affect, behavior normal, pleasant  Breast/gyn/rectal - deferred today     Assessment and Plan :    Encounter Diagnoses  Name Primary?  . Annual physical exam Yes  . Encounter for surveillance of contraceptives   . Screening for condition   . Immunity status testing   . Travel advice encounter   . Screening for tuberculosis   . Screening for cervical cancer   . Screen for STD (sexually transmitted disease)      Physical exam - discussed healthy lifestyle, diet, exercise, preventative care, vaccinations, and addressed their concerns.  PPD placed, and she will return in 48 hours for recheck as well as visit for pap smear, pelvic exam, for contraception refills. STD screen today, routine labs and titers for school entrance requirements Contraception management - discussed  risks/benefits, and she has done well on current medication.    Recommended flu and Bexsero vaccines.  She declines today, will consider on her f/u appt this week.  F/u pending labs.

## 2015-08-10 NOTE — Telephone Encounter (Signed)
Please call her pharmacy.  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   Please call pharmacy and inquire as to what options do we have in dispensing: can she pickup a year's supply of OCPs at the pharmacy?

## 2015-08-11 LAB — MEASLES/MUMPS/RUBELLA IMMUNITY
Mumps IgG: 90.4 AU/mL — ABNORMAL HIGH (ref <9.00)
Rubella: 1.16 Index — ABNORMAL HIGH (ref <0.90)

## 2015-08-11 LAB — HEPATITIS B SURFACE ANTIBODY,QUALITATIVE: Hep B S Ab: NEGATIVE

## 2015-08-11 LAB — VARICELLA ZOSTER ANTIBODY, IGG: Varicella IgG: 2531 Index — ABNORMAL HIGH (ref <135.00)

## 2015-08-11 LAB — HIV ANTIBODY (ROUTINE TESTING W REFLEX): HIV: NONREACTIVE

## 2015-08-11 LAB — RPR

## 2015-08-11 NOTE — Telephone Encounter (Signed)
Pt will call insurance company and ask about [facility]

## 2015-08-11 NOTE — Telephone Encounter (Signed)
Please call her and let her know so she can find out before tomorrow (pap exam tomorrow here)

## 2015-08-11 NOTE — Telephone Encounter (Signed)
SHE WOULD HAVE TO CALL HER INS TO HAVE THEM OVER RIDE TO GET INS. TO PAY FOR THE WHOLE YEAR OR SHE WILL HAVE TO PAY CASH PRICE

## 2015-08-12 ENCOUNTER — Encounter: Payer: Self-pay | Admitting: Medical

## 2015-08-12 ENCOUNTER — Other Ambulatory Visit (HOSPITAL_COMMUNITY)
Admission: RE | Admit: 2015-08-12 | Discharge: 2015-08-12 | Disposition: A | Discharge status: Home / Self Care | Payer: 59 | Source: Ambulatory Visit | Attending: Medical | Admitting: Medical

## 2015-08-12 ENCOUNTER — Ambulatory Visit (INDEPENDENT_AMBULATORY_CARE_PROVIDER_SITE_OTHER): Payer: 59 | Admitting: Medical

## 2015-08-12 VITALS — BP 114/80 | HR 72 | Temp 98.1°F | Resp 14 | Ht 69.0 in | Wt 153.0 lb

## 2015-08-12 DIAGNOSIS — Z23 Encounter for immunization: Secondary | ICD-10-CM

## 2015-08-12 DIAGNOSIS — Z01419 Encounter for gynecological examination (general) (routine) without abnormal findings: Secondary | ICD-10-CM | POA: Diagnosis not present

## 2015-08-12 DIAGNOSIS — Z124 Encounter for screening for malignant neoplasm of cervix: Secondary | ICD-10-CM | POA: Diagnosis not present

## 2015-08-12 DIAGNOSIS — Z3040 Encounter for surveillance of contraceptives, unspecified: Secondary | ICD-10-CM

## 2015-08-12 DIAGNOSIS — Z7189 Other specified counseling: Secondary | ICD-10-CM

## 2015-08-12 DIAGNOSIS — Z113 Encounter for screening for infections with a predominantly sexual mode of transmission: Secondary | ICD-10-CM | POA: Insufficient documentation

## 2015-08-12 DIAGNOSIS — Z7185 Encounter for immunization safety counseling: Secondary | ICD-10-CM

## 2015-08-12 MED ORDER — NORGESTIMATE-ETH ESTRADIOL 0.25-35 MG-MCG PO TABS: 1.0000 | ORAL_TABLET | Freq: Every day | ORAL | Status: DC

## 2015-08-12 NOTE — Progress Notes (Signed)
Subjective Here for f/u.  At her visit a few days for physical, we discussed several items.  She is here today to discuss labs.  Here for PPD reading, here for pap smear and refill on contraception.  She needs a year's worth of medication as she will be attending [employer] as of 12/2015.   No other new c/o.   See visit notes for more details from 2 days ago.     Objective: BP 114/80 mmHg  Pulse 72  Temp(Src) 98.1 F (36.7 C) (Oral)  Resp 14  Ht  (1.753 m)  Wt 153 lb (69.4 kg)  BMI 22.58 kg/m2  Breast: not examined Gyn: Normal external genitalia without lesions, vagina with normal mucosa, cervix with erythema, no cervical motion tenderness, slight white vaginal discharge.  Uterus and adnexa not enlarged, nontender, no masses.  Pap performed.  Exam chaperoned by nurse. Rectal: deferred  Assessment: Encounter Diagnoses  Name Primary?  . Encounter for surveillance of contraceptives Yes  . Screening for cervical cancer   . Screen for STD (sexually transmitted disease)   . Need for hepatitis B vaccination   . Vaccine counseling     Plan: discussed risks/benefits of OCPs, urine pregnancy negative, refills sent.  discussed safe sex.   Pap sent PPD negative Discussed labs from her recent visit.   She was not immune to hep B. Counseled on the Hepatitis B virus vaccine.  Vaccine information sheet given.  Hepatitis B vaccine given after consent obtained.  Patient was advised to return in 2 months for Hep B #2, and in 6 months for Hep B #3.   Advised flu and Bexsero vaccines but she declines Completed her paperwork for school.

## 2015-08-18 LAB — CYTOLOGY - PAP

## 2015-08-19 ENCOUNTER — Other Ambulatory Visit: Payer: Self-pay | Admitting: Medical

## 2015-08-19 ENCOUNTER — Other Ambulatory Visit: Payer: Self-pay | Admitting: *Deleted

## 2015-08-19 MED ORDER — METRONIDAZOLE 500 MG PO TABS: ORAL_TABLET | ORAL | Status: DC

## 2015-08-20 NOTE — Addendum Note (Signed)
Addended by: Jac Canavan on: 08/20/2015 10:31 AM   Modules accepted: Level of Service

## 2015-09-14 ENCOUNTER — Other Ambulatory Visit: Payer: 59

## 2015-09-14 ENCOUNTER — Encounter: Payer: Self-pay | Admitting: Medical

## 2015-09-14 ENCOUNTER — Ambulatory Visit (INDEPENDENT_AMBULATORY_CARE_PROVIDER_SITE_OTHER): Payer: 59 | Admitting: Medical

## 2015-09-14 VITALS — BP 110/80 | HR 72 | Wt 152.0 lb

## 2015-09-14 DIAGNOSIS — Z23 Encounter for immunization: Secondary | ICD-10-CM | POA: Diagnosis not present

## 2015-09-14 DIAGNOSIS — J302 Other seasonal allergic rhinitis: Secondary | ICD-10-CM

## 2015-09-14 DIAGNOSIS — L299 Pruritus, unspecified: Secondary | ICD-10-CM | POA: Diagnosis not present

## 2015-09-14 DIAGNOSIS — H938X3 Other specified disorders of ear, bilateral: Secondary | ICD-10-CM | POA: Diagnosis not present

## 2015-09-14 MED ORDER — BECLOMETHASONE DIPROPIONATE 80 MCG/ACT NA AERS: 1.0000 | INHALATION_SPRAY | Freq: Two times a day (BID) | NASAL | Status: DC

## 2015-09-14 NOTE — Progress Notes (Signed)
  Subjective: Chief Complaint  Patient presents with  . Immunizations    also thinks her ears are compacted. declined flu shot    Betty Bentley is a 24 y.o. female who presents for evaluation of ear itching, nasal congestion, sneezing, irritated eyes.  Thinks her ears are full of wax.   Also needs Hep B #2.    The following portions of the patient's history were reviewed and updated as appropriate: allergies, current medications, past family history, past medical history, past social history, past surgical history and problem list.  ROS as in subjective   Objective: BP 110/80 mmHg  Pulse 72  Wt 152 lb (68.947 kg)  LMP 08/24/2015  General appearance: Alert, WD/WN, no distress                             Skin: warm, no rash                           Head: no sinus tenderness                            Eyes: conjunctiva normal, corneas clear, PERRLA                            Ears: pearly TMs, external ear canals normal                          Nose: septum midline, turbinates swollen, no erythema, no discharge                  Mouth/throat: MMM, tongue normal, no pharyngeal erythema or exudate                           Neck: supple, no adenopathy, no thyromegaly, non tender                         Lungs: CTA bilaterally, no wheezes, rales, or rhonchi   Assessment:   Encounter Diagnoses  Name Primary?  . Ear fullness, bilateral Yes  . Other seasonal allergic rhinitis   . Ear itch   . Need for hepatitis B vaccination      Plan:   advised she has no impacted cerumen. discussed symptoms that suggest allergic rhinitis.  Begin trial of Qnasal.   When she completes this can use trial of Dymista.  discussed both, proper use, expectations.   C/t Claritin OTC.  Counseled on the Hepatitis B virus vaccine.  Vaccine information sheet given.  Hepatitis B vaccine given after consent obtained.  Patient was advised to return in 6 months for Hep B #3.

## 2016-04-11 ENCOUNTER — Other Ambulatory Visit (INDEPENDENT_AMBULATORY_CARE_PROVIDER_SITE_OTHER): Payer: 59

## 2016-04-11 DIAGNOSIS — Z23 Encounter for immunization: Secondary | ICD-10-CM | POA: Diagnosis not present

## 2016-04-14 ENCOUNTER — Telehealth: Payer: Self-pay | Admitting: Medical

## 2016-04-14 NOTE — Telephone Encounter (Signed)
Pt need to have her CPE & pap done on 08/12/16 that is the earliest she can have it for insur coverage reasons then after that date she will be out of the country for a while. Vincenza HewsShane is out of the office that day. Can pt get CPE & pap with Vickie?  Also pt need refill on Sprintec 0.25-35 mg now. Says she need about 4 more

## 2016-04-14 NOTE — Telephone Encounter (Signed)
1) she shouldn't be out of birth control as I wrote for a year supply at her last physical 2) regarding the date, this is the ONLY date she can come?  And I happened to be off that day?  If so, then ok to see Vickie, but double check she can't come a day nearby that time or verify I'm off that day?

## 2016-04-18 NOTE — Telephone Encounter (Signed)
Vickie, Is it ok to sched CPE with you on 9/1 as that is that is the first day that pt can have CPE per Insur then she will be leaving to go out of the country after that date.  FYI med issue was resolved with pharmacy

## 2016-04-18 NOTE — Telephone Encounter (Signed)
I am ok with this. I see Vincenza HewsShane approved it. Thanks.

## 2016-04-19 NOTE — Telephone Encounter (Signed)
Called pt and lm that she can sched appt with Chip BoerVicki.

## 2016-07-15 ENCOUNTER — Telehealth: Payer: Self-pay | Admitting: Medical

## 2016-07-15 ENCOUNTER — Other Ambulatory Visit: Payer: Self-pay | Admitting: Medical

## 2016-07-15 MED ORDER — NORGESTIMATE-ETH ESTRADIOL 0.25-35 MG-MCG PO TABS
1.0000 | ORAL_TABLET | Freq: Every day | ORAL | 0 refills | Status: DC
Start: 2016-07-15 — End: 2016-07-19

## 2016-07-15 NOTE — Telephone Encounter (Signed)
Pt called again and stated she needs med filled ASAP. Sending to [facility] high priority.

## 2016-07-15 NOTE — Telephone Encounter (Signed)
I will defer to [facility] on this refill since I have never seen her. Thanks.

## 2016-07-15 NOTE — Telephone Encounter (Signed)
Pt called to cancel her 9/1 CPE & Pap appt that was scheduled with Vickie under special approval since she is out of the country a lot however she just found out that plans changed & she will not be in the country until January so she need refill on Sprintec asap in order to have it mailed to her & she will rescdule CPE when she knows for sure when she will be in the country.

## 2016-07-15 NOTE — Telephone Encounter (Signed)
I can mail/send 90 day supply.   I printed the script, but remind her she is due appt/physical.  Find out how she wants [place] to send/mail/fax

## 2016-07-18 NOTE — Telephone Encounter (Signed)
lmtcb

## 2016-07-19 MED ORDER — NORGESTIMATE-ETH ESTRADIOL 0.25-35 MG-MCG PO TABS: 1.0000 | ORAL_TABLET | Freq: Every day | ORAL | 0 refills | Status: DC

## 2016-07-19 NOTE — Telephone Encounter (Signed)
Will mail rx to her. shane can not write script for a year but can do 180 tablets instead and then she will need an appt. She is out of the country and can not be reached

## 2016-08-01 ENCOUNTER — Telehealth: Payer: Self-pay | Admitting: Medical

## 2016-08-01 DIAGNOSIS — Z7184 Encounter for health counseling related to travel: Secondary | ICD-10-CM

## 2016-08-01 MED ORDER — CIPROFLOXACIN HCL 500 MG PO TABS: ORAL_TABLET | ORAL | 0 refills | Status: DC

## 2016-08-01 NOTE — Telephone Encounter (Signed)
Requesting refill on Cipro 500 mg #60. Pt said she takes this for out of country traveling & her out of country stay has been extended until January now so she is needing a refill on this med asap so that it can be shipped to her to last until she return.

## 2016-08-12 ENCOUNTER — Encounter: Payer: Self-pay | Admitting: Family Medicine

## 2016-08-16 ENCOUNTER — Encounter: Payer: Self-pay | Admitting: Family Medicine

## 2016-12-15 ENCOUNTER — Encounter: Payer: 59 | Admitting: Medical

## 2016-12-16 ENCOUNTER — Other Ambulatory Visit: Payer: Self-pay | Admitting: Medical

## 2016-12-19 ENCOUNTER — Encounter: Payer: Self-pay | Admitting: Medical

## 2016-12-19 ENCOUNTER — Encounter: Payer: 59 | Admitting: Medical

## 2016-12-19 ENCOUNTER — Ambulatory Visit (INDEPENDENT_AMBULATORY_CARE_PROVIDER_SITE_OTHER): Payer: 59 | Admitting: Medical

## 2016-12-19 ENCOUNTER — Other Ambulatory Visit (HOSPITAL_COMMUNITY)
Admission: RE | Admit: 2016-12-19 | Discharge: 2016-12-19 | Disposition: A | Discharge status: Home / Self Care | Payer: 59 | Source: Ambulatory Visit | Attending: Medical | Admitting: Medical

## 2016-12-19 VITALS — BP 122/70 | HR 79 | Ht 69.0 in | Wt 150.4 lb

## 2016-12-19 DIAGNOSIS — Z124 Encounter for screening for malignant neoplasm of cervix: Secondary | ICD-10-CM

## 2016-12-19 DIAGNOSIS — Z23 Encounter for immunization: Secondary | ICD-10-CM

## 2016-12-19 DIAGNOSIS — Z113 Encounter for screening for infections with a predominantly sexual mode of transmission: Secondary | ICD-10-CM

## 2016-12-19 DIAGNOSIS — Z0000 Encounter for general adult medical examination without abnormal findings: Secondary | ICD-10-CM

## 2016-12-19 DIAGNOSIS — Z01419 Encounter for gynecological examination (general) (routine) without abnormal findings: Secondary | ICD-10-CM | POA: Insufficient documentation

## 2016-12-19 DIAGNOSIS — Z7189 Other specified counseling: Secondary | ICD-10-CM

## 2016-12-19 DIAGNOSIS — Z13220 Encounter for screening for lipoid disorders: Secondary | ICD-10-CM | POA: Diagnosis not present

## 2016-12-19 DIAGNOSIS — Z7184 Encounter for health counseling related to travel: Secondary | ICD-10-CM

## 2016-12-19 DIAGNOSIS — Z309 Encounter for contraceptive management, unspecified: Secondary | ICD-10-CM

## 2016-12-19 LAB — CBC WITH DIFFERENTIAL/PLATELET
BASOS ABS: 0 {cells}/uL (ref 0–200)
BASOS PCT: 0 %
EOS ABS: 183 {cells}/uL (ref 15–500)
Eosinophils Relative: 3 %
HEMATOCRIT: 38.5 % (ref 35.0–45.0)
Hemoglobin: 12.3 g/dL (ref 11.7–15.5)
Lymphocytes Relative: 44 %
Lymphs Abs: 2684 cells/uL (ref 850–3900)
MCH: 30.4 pg (ref 27.0–33.0)
MCHC: 31.9 g/dL — ABNORMAL LOW (ref 32.0–36.0)
MCV: 95.3 fL (ref 80.0–100.0)
MONOS PCT: 7 %
MPV: 9.9 fL (ref 7.5–12.5)
Monocytes Absolute: 427 cells/uL (ref 200–950)
Neutro Abs: 2806 cells/uL (ref 1500–7800)
Neutrophils Relative %: 46 %
PLATELETS: 305 10*3/uL (ref 140–400)
RBC: 4.04 MIL/uL (ref 3.80–5.10)
RDW: 12.7 % (ref 11.0–15.0)
WBC: 6.1 10*3/uL (ref 4.0–10.5)

## 2016-12-19 LAB — POCT URINALYSIS DIPSTICK
Bilirubin, UA: NEGATIVE
GLUCOSE UA: NEGATIVE
Ketones, UA: NEGATIVE
LEUKOCYTES UA: NEGATIVE
Nitrite, UA: NEGATIVE
Protein, UA: NEGATIVE
SPEC GRAV UA: 1.025
UROBILINOGEN UA: NEGATIVE
pH, UA: 6.5

## 2016-12-19 LAB — LIPID PANEL
CHOL/HDL RATIO: 1.9 ratio (ref <5.0)
CHOLESTEROL: 130 mg/dL (ref <200)
HDL: 68 mg/dL (ref >50)
LDL Cholesterol: 43 mg/dL (ref <100)
Triglycerides: 93 mg/dL (ref <150)
VLDL: 19 mg/dL (ref <30)

## 2016-12-19 LAB — POCT URINE PREGNANCY: Preg Test, Ur: NEGATIVE

## 2016-12-19 MED ORDER — NORGESTIMATE-ETH ESTRADIOL 0.25-35 MG-MCG PO TABS: 1.0000 | ORAL_TABLET | Freq: Every day | ORAL | 0 refills | Status: DC

## 2016-12-19 MED ORDER — CIPROFLOXACIN HCL 500 MG PO TABS: ORAL_TABLET | ORAL | 0 refills | Status: DC

## 2016-12-19 NOTE — Progress Notes (Signed)
Subjective:   HPI  Betty Bentley is a 26 y.o. female who presents for a complete physical.  Concerns: Is attending [employer] in the [place], in 2nd year.  Needs year's worth of OCPs and wants to have Cipro on hand for potential UTI or travelers diarrhea.    Doing well without c/o.   No current sexual partner.   Doing well on contraception which controls menstrual cramps. Wants to c/t this.  Periods regular, not heavy.  No prior pregnancies.      Reviewed their medical, surgical, family, social, medication, and allergy history and updated chart as appropriate.  Past Medical History:  Diagnosis Date  . Allergy   . Contraception management   . H/O echocardiogram 06/2013   mild LVH, otherwise normal  . Heart murmur 06/2013   echo with mild pattern of LVH, otherwise normal    Past Surgical History:  Procedure Laterality Date  . NO PAST SURGERIES  07/2015    Social History   Social History  . Marital status: Single    Spouse name: N/A  . Number of children: N/A  . Years of education: N/A   Occupational History  . Not on file.   Social History Main Topics  . Smoking status: Never Smoker  . Smokeless tobacco: Never Used  . Alcohol use No     Comment: occasionally  . Drug use: No  . Sexual activity: Yes    Birth control/ protection: Condom, Pill     Comment: single, biology major at [employer], exercise - runs track   Other Topics Concern  . Not on file   Social History Narrative   Graduated [employer]/2014, [employer], biology BS.  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  Exercising 3 days per week.   Living with family in [place] currently.    Family History  Problem Relation Age of Onset  . Heart disease Father [id number]    stenosis  . Arthritis Maternal Grandfather   . Hypertension Maternal Grandfather   . Thyroid disease Maternal Grandmother   . Stroke Neg Hx   . Diabetes Neg Hx      Current Outpatient  Prescriptions:  .  ciprofloxacin (CIPRO) 500 MG tablet, BID x 3 days for UTI, BID x 7 days for travelers diarrhea, Disp: 60 tablet, Rfl: 0 .  norgestimate-ethinyl estradiol (SPRINTEC 28) 0.25-35 MG-MCG tablet, Take 1 tablet by mouth daily. Year supply since she will be out of country, Disp: 336 tablet, Rfl: 0  No Known Allergies   Review of Systems Constitutional: -fever, -chills, -sweats, -unexpected weight change, -decreased appetite, -fatigue Allergy: -sneezing, -itching, -congestion Dermatology: -changing moles, --rash, -lumps ENT: -runny nose, -ear pain, -sore throat, -hoarseness, -sinus pain, -teeth pain, - ringing in ears, -hearing loss, -nosebleeds Cardiology: -chest pain, -palpitations, -swelling, -difficulty breathing when lying flat, -waking up short of breath Respiratory: -cough, -shortness of breath, -difficulty breathing with exercise or exertion, -wheezing, -coughing up blood Gastroenterology: -abdominal pain, -nausea, -vomiting, -diarrhea, -constipation, -blood in stool, -changes in bowel movement, -difficulty swallowing or eating Hematology: -bleeding, -bruising  Musculoskeletal: -joint aches, -muscle aches, -joint swelling, -back pain, -neck pain, -cramping, -changes in gait Ophthalmology: denies vision changes, eye redness, itching, discharge Urology: -burning with urination, -difficulty urinating, -blood in urine, -urinary frequency, -urgency, -incontinence Neurology: -headache, -weakness, -tingling, -numbness, -memory loss, -falls, -dizziness Psychology: -depressed mood, -agitation, -sleep problems     Objective:   Physical Exam  BP 122/70   Pulse 79  Ht 5\' 9"  (1.753 m)   Wt 150 lb 6.4 oz (68.2 kg)   LMP 12/12/2016   SpO2 99%   BMI 22.21 kg/m   General appearance: alert, no distress, WD/WN, lean AA female Skin: small tattoo right middle finger dorsal lateral side of middle phalanx, tattoo of left dorsal foot and tattoo right low back, no worrisome lesions,  few scattered macules throughout HEENT: normocephalic, conjunctiva/corneas normal, sclerae anicteric, PERRLA, EOMi, nares patent, no discharge or erythema, pharynx normal Oral cavity: MMM, tongue normal, teeth in good repair Neck: supple, no lymphadenopathy, no thyromegaly, no masses, normal ROM Chest: non tender, normal shape and expansion Heart: brief faint 1-2/6 systolic murmur heard best left upper sternal border, otherwise RRR, normal S1, S2 Lungs: CTA bilaterally, no wheezes, rhonchi, or rales Abdomen: +bs, soft, non tender, non distended, no masses, no hepatomegaly, no splenomegaly, no bruits Back: non tender, normal ROM, no scoliosis Musculoskeletal: upper extremities non tender, no obvious deformity, normal ROM throughout, lower extremities non tender, no obvious deformity, normal ROM throughout Extremities: no edema, no cyanosis, no clubbing Pulses: 2+ symmetric, upper and lower extremities, normal cap refill Neurological: alert, oriented x 3, CN2-12 intact, strength normal upper extremities and lower extremities, sensation normal throughout, DTRs 2+ throughout, no cerebellar signs, gait normal Psychiatric: normal affect, behavior normal, pleasant   Breast: deferred Gyn: Normal external genitalia without lesions, vagina with normal mucosa, cervix with mild erythema, no cervical motion tenderness, mild white vaginal discharge.  Uterus and adnexa not enlarged, nontender, no masses.  Pap performed.  Exam chaperoned by nurse. Rectal: deferred     Assessment and Plan :    Encounter Diagnoses  Name Primary?  . Routine general medical examination at a health care facility Yes  . Screening for cervical cancer   . Need for TD vaccine   . Screening for lipid disorders   . Encounter for contraceptive management, unspecified type   . Screen for STD (sexually transmitted disease)   . Travel advice encounter      Physical exam - discussed healthy lifestyle, diet, exercise, preventative  care, vaccinations, and addressed their concerns.  Contraception management - discussed risks/benefits, and she has done well on current medication.   Discussed safe sex, condom use. Counseled on the Td (tetanus, diptheria) vaccine.  Vaccine information sheet given. Td vaccine given after consent obtained. She declines flu shot F/u pending labs.

## 2016-12-20 LAB — RPR

## 2016-12-20 LAB — HIV ANTIBODY (ROUTINE TESTING W REFLEX): HIV 1&2 Ab, 4th Generation: NONREACTIVE

## 2016-12-21 LAB — CYTOLOGY - PAP
Chlamydia: NEGATIVE
Diagnosis: NEGATIVE
Neisseria Gonorrhea: NEGATIVE

## 2016-12-22 ENCOUNTER — Other Ambulatory Visit: Payer: Self-pay | Admitting: Medical

## 2016-12-22 MED ORDER — METRONIDAZOLE 500 MG PO TABS: 500.0000 mg | ORAL_TABLET | Freq: Three times a day (TID) | ORAL | 0 refills | Status: DC

## 2016-12-22 MED ORDER — METRONIDAZOLE 500 MG PO TABS: ORAL_TABLET | ORAL | 0 refills | Status: DC

## 2017-05-22 ENCOUNTER — Other Ambulatory Visit: Payer: Self-pay

## 2017-05-22 ENCOUNTER — Telehealth: Payer: Self-pay | Admitting: Medical

## 2017-05-22 ENCOUNTER — Other Ambulatory Visit: Payer: Self-pay | Admitting: Medical

## 2017-05-22 DIAGNOSIS — Z7184 Encounter for health counseling related to travel: Secondary | ICD-10-CM

## 2017-05-22 MED ORDER — CIPROFLOXACIN HCL 500 MG PO TABS: ORAL_TABLET | ORAL | 0 refills | Status: DC

## 2017-05-22 NOTE — Telephone Encounter (Signed)
Pt requested a refill on Cipro because she will be traveling out of the country again to the same place next week.

## 2017-05-22 NOTE — Telephone Encounter (Signed)
Sent to pharmacy 

## 2017-05-22 NOTE — Telephone Encounter (Signed)
Call it in 

## 2017-11-27 ENCOUNTER — Other Ambulatory Visit: Payer: Self-pay | Admitting: Medical

## 2017-12-21 ENCOUNTER — Other Ambulatory Visit: Payer: Self-pay | Admitting: Medical

## 2017-12-21 DIAGNOSIS — Z7184 Encounter for health counseling related to travel: Secondary | ICD-10-CM

## 2017-12-27 ENCOUNTER — Telehealth: Payer: Self-pay | Admitting: Medical

## 2017-12-27 ENCOUNTER — Other Ambulatory Visit: Payer: Self-pay | Admitting: Medical

## 2017-12-27 DIAGNOSIS — Z7184 Encounter for health counseling related to travel: Secondary | ICD-10-CM

## 2017-12-27 MED ORDER — CIPROFLOXACIN HCL 500 MG PO TABS: ORAL_TABLET | ORAL | 0 refills | Status: DC

## 2017-12-27 NOTE — Telephone Encounter (Signed)
Pt called requesting Cipro refill because she is traveling back to [place] on this Friday (12/29/17) for school. Pt usually takes Cipro for as a precausion

## 2017-12-27 NOTE — Telephone Encounter (Signed)
Refilled

## 2018-11-01 ENCOUNTER — Encounter: Payer: Self-pay | Admitting: Family Medicine

## 2018-11-01 ENCOUNTER — Other Ambulatory Visit: Payer: Self-pay

## 2018-11-01 ENCOUNTER — Ambulatory Visit (INDEPENDENT_AMBULATORY_CARE_PROVIDER_SITE_OTHER): Payer: Self-pay | Admitting: Family Medicine

## 2018-11-01 VITALS — BP 109/75 | HR 93 | Temp 98.8°F | Resp 16 | Wt 152.5 lb

## 2018-11-01 DIAGNOSIS — Z7185 Encounter for immunization safety counseling: Secondary | ICD-10-CM

## 2018-11-01 DIAGNOSIS — Z7189 Other specified counseling: Secondary | ICD-10-CM

## 2018-11-01 NOTE — Progress Notes (Signed)
Betty Bentley, is a 27 y.o. female  [id number]  [id number]  DOB - 04/22/1991  CC:  Chief Complaint  Patient presents with  . Establish Care       HPI: Mayukha is a 27 y.o. female is here today to establish care.   Henry Demeritt has Allergic rhinitis on their problem list.    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 Was advised to obtain titers for all immunizations.  She is current with influenza vaccine and has completed a TB screening. She has lives in [place] over the last several years studying for medical school.  Patient denies new headaches, chest pain, abdominal pain, nausea, new weakness , numbness or tingling, SOB, edema, or worrisome cough.   Current medications: Current Outpatient Medications:  .  SPRINTEC 28 0.25-35 MG-MCG tablet, TAKE ONE TABLET BY MOUTH ONCE DAILY, Disp: 336 tablet, Rfl: 0   Pertinent family medical history: family history includes Arthritis in her maternal grandfather; Glaucoma in her maternal grandfather; Heart disease (age of onset: 51) in her father; Hypertension in her maternal grandfather; Thyroid disease in her maternal grandmother.   No Known Allergies  Social History   Socioeconomic History  . Marital status: Single    Spouse name: Not on file  . Number of children: Not on file  . Years of education: Not on file  . Highest education level: Not on file  Occupational History  . Not on file  Social Needs  . Financial resource strain: Not on file  . Food insecurity:    Worry: Not on file    Inability: Not on file  . Transportation needs:    Medical: Not on file    Non-medical: Not on file  Tobacco Use  . Smoking status: Never Smoker  . Smokeless tobacco: Never Used  Substance and Sexual Activity  . Alcohol use: No    Comment: occasionally  . Drug use: No  . 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 Physical activity:    Days per week: Not on file    Minutes per session: Not on file  . Stress: Not on file  Relationships  . Social connections:    Talks on phone: Not on file    Gets together: Not on file    Attends religious service: Not on file    Active member of club or organization: Not on file    Attends meetings of clubs or organizations: Not on file    Relationship status: Not on file  . Intimate partner violence:    Fear of current or ex partner: Not on file    Emotionally abused: Not on file    Physically abused: Not on file    Forced sexual activity: Not on file  Other Topics Concern  . Not on file  Social History Narrative   Graduated 04/2014, [employer], biology BS.  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  Exercising 3 days per week.   Living with family in [place] currently.    Review of Systems: Constitutional: Negative for fever, chills, diaphoresis, activity change, appetite change and fatigue. HENT: Negative for ear pain, nosebleeds, congestion, facial swelling, rhinorrhea, neck pain, neck stiffness and ear discharge.  Eyes: Negative for pain, discharge, redness, itching and visual disturbance. Respiratory: Negative for cough, choking, chest  tightness, shortness of breath, wheezing and stridor.  Cardiovascular: Negative for chest pain, palpitations and leg swelling. Gastrointestinal: Negative for abdominal distention. Genitourinary: Negative for dysuria, urgency, frequency, hematuria, flank pain, decreased urine volume, difficulty urinating. Musculoskeletal: Negative for back pain, joint swelling, arthralgia and gait problem. Neurological: Negative for dizziness, tremors, seizures, syncope, facial asymmetry, speech difficulty, weakness, light-headedness, numbness and headaches.  Hematological: Negative for adenopathy. Does not bruise/bleed easily. Psychiatric/Behavioral:  Negative for hallucinations, behavioral problems, confusion, dysphoric mood, decreased concentration and agitation.    Objective:   Vitals:   11/01/18 1450  BP: 109/75  Pulse: 93  Resp: 16  Temp: 98.8 F (37.1 C)  SpO2: 98%    BP Readings from Last 3 Encounters:  11/01/18 109/75  12/19/16 122/70  09/14/15 110/80    Filed Weights   11/01/18 1450  Weight: 152 lb 8 oz (69.2 kg)      Physical Exam: Constitutional: Patient appears well-developed and well-nourished. No distress. HENT: Normocephalic, atraumatic, External right and left ear normal. Oropharynx is clear and moist.  Eyes: Conjunctivae and EOM are normal. PERRLA, no scleral icterus. Neck: Normal ROM. Neck supple. No JVD. No tracheal deviation. No thyromegaly. CVS: RRR, S1/S2 +, no murmurs, no gallops, no carotid bruit.  Pulmonary: Effort and breath sounds normal, no stridor, rhonchi, wheezes, rales.  Abdominal: Soft. BS +, no distension, tenderness, rebound or guarding.  Musculoskeletal: Normal range of motion. No edema and no tenderness.  Neuro: Alert. Normal muscle tone coordination. Normal gait. BUE and BLE strength 5/5. Bilateral hand grips symmetrical. No cranial nerve deficit. Skin: Skin is warm and dry. No rash noted. Not diaphoretic. No erythema. No pallor. Psychiatric: Normal mood and affect. Behavior, judgment, thought content normal.  Lab Results (prior encounters)  Lab Results  Component Value Date   WBC 6.1 12/19/2016   HGB 12.3 12/19/2016   HCT 38.5 12/19/2016   MCV 95.3 12/19/2016   PLT 305 12/19/2016   Lab Results  Component Value Date   CREATININE 0.76 08/10/2015   BUN 9 08/10/2015   NA 138 08/10/2015   K 4.5 08/10/2015   CL 101 08/10/2015   CO2 23 08/10/2015    No results found for: HGBA1C     Component Value Date/Time   CHOL 130 12/19/2016 1105   TRIG 93 12/19/2016 1105   HDL 68 12/19/2016 1105   CHOLHDL 1.9 12/19/2016 1105   VLDL 19 12/19/2016 1105   LDLCALC 43 12/19/2016  1105        Assessment and plan:  1. Immunization counseling - Measles/Mumps/Rubella Immunity - Tetanus Antibody, IGG - Varicella zoster antibody, IgG - Hepatitis B surface antibody,quantitative - Hepatitis B surface antigen - Varicella zoster antibody, IgM    Follow-up as needed.   The patient was given clear instructions to go to ER or return to medical center if symptoms don't improve, worsen or new problems develop. The patient verbalized understanding. The patient was advised  to call and obtain lab results if they haven't heard anything from out office within 7-10 business days.   A total of 25  minutes spent, greater than 50 % of this time was spent reviewing documentation out of country, reviewing immunization records, reviewing history and counseling and coordination of care.    Joaquin CourtsKimberly Tucker Steedley, FNP [facility] [address], [place] [id number] [phone]: [phone]    This note has been created with Dragon speech recognition software and Paediatric nursesmart phrase technology. Any transcriptional errors are unintentional.

## 2018-11-01 NOTE — Patient Instructions (Signed)
Thank you for choosing [facility] at [facility] for your medical home!    Betty Bentley was seen by Molli Barrows, FNP today.   Enid Derry Pflug's primary care doctor is Scot Jun, FNP.   For the best care possible,  you should try to see Molli Barrows, FNP-C  whenever you come to clinic.   We look forward to seeing you again soon!  If you have any questions about your visit today,  please call us at [phone]  Or feel free to reach your provider via [place].    Immunization Schedule, Adult Recommended immunizations These include:  Influenza vaccine. ? All adults should be immunized every year. ? All adults, including pregnant women and people with hives-only allergy to eggs can receive the inactivated influenza vaccine (IIV) or recombinant influenza vaccine (RIV). ? Adults aged 18-64 years can receive the IIV or RIV. The RIV vaccine does not contain any egg protein. ? Adults aged 89 years or older can receive the high-dose or adjuvanted IIV.  Tetanus, diphtheria, and acellular pertussis (Td, Tdap) vaccine. ? Pregnant women should receive 1 dose of Tdap vaccine during each pregnancy. The dose should be obtained regardless of the length of time since the last dose. Immunization is preferred during the 27th to 36th week of gestation. ? An adult who has not previously received Tdap or who does not know his or her vaccine status should receive 1 dose of Tdap. This initial dose should be followed by tetanus and diphtheria toxoids (Td) booster doses every 10 years. ? Adults with an unknown or incomplete history of completing a 3-dose immunization series with Td-containing vaccines should begin or complete a primary immunization series including a Tdap dose. The first 2 doses should be received at least 4 weeks apart, and the third dose 6-12 months after the second dose. ? Adults should receive a Td booster every 10 years.  Varicella vaccine. ? An adult without  evidence of immunity to varicella should receive 2 doses 4-8 weeks apart, or a second dose if he or she has previously received 1 dose. ? Pregnant females who do not have evidence of immunity should receive the first dose after pregnancy. This first dose should be obtained before leaving the health care facility. The second dose should be obtained 4-8 weeks after the first dose. ? All healthcare workers should have evidence of immunity to varicella. ? Adults with cancer or those who are on therapy to suppress the immune system should not receive the varicella vaccine.  Human papillomavirus (HPV) vaccine. ? Females aged 13-26 years who have not received the vaccine previously should obtain the 3-dose series. Females should receive the second dose 1-2 months after the first dose, and the third dose 6 months after the first dose. ? The vaccine is not recommended for use in pregnant females. However, pregnancy testing is not needed before receiving a dose. If a female is found to be pregnant after receiving a dose, no treatment is needed. In that case, the remaining doses should be delayed until after the pregnancy. ? Males aged 5-21 years who have not received the vaccine previously should receive the 3-dose series. Males aged 22-26 years may also receive a 3 dose series. Males should receive the second dose 1-2 months after the first dose, and the third dose 6 months after the first dose. ? Adult females up to age 53 years and adult males up to age 23 years who initiated the HPV vaccine series before age  15 years and received 2 doses at least 5 months apart do not need an additional dose of the vaccine. ? Adult females up to age 78 years and adult female up to age 77 years who initiated the HPV vaccine series before 15 years but only received 1 dose or 2 doses less than 5 months apart should receive 1 additional dose of the vaccine. ? Immunization is recommended through the age of 4 years for any female who  has sex with males and did not get any or all doses earlier. ? Immunization is recommended for any person with an immunocompromised condition through the age of 63 years if he or she did not get any or all doses earlier.  Zoster vaccine. ? One dose is recommended for adults aged 12 years or older unless certain conditions are present.  Measles, mumps, and rubella (MMR) vaccine. ? Adults born before [birth year] generally are considered immune to measles and mumps. ? Adults born in [birth year] or later should have 1 or more doses of MMR vaccine unless there is a contraindication to the vaccine or there is laboratory evidence of immunity to each of the three diseases. ? A routine second dose of MMR vaccine should be obtained at least 28 days after the first dose for students attending postsecondary schools, health care workers, or international travelers. ? People who received inactivated measles vaccine or an unknown type of measles vaccine during 1963-1967 should be revaccinated with 1 or 2 doses of MMR vaccine. ? People who received inactivated mumps vaccine or an unknown type of mumps vaccine before 1979 and are at high risk for mumps infection should consider immunization with 2 doses of MMR vaccine. ? For females of childbearing age, rubella immunity should be determined. If there is no evidence of immunity, females who are not pregnant should receive 1 dose of MMR. If there is no evidence of immunity, females who are pregnant should receive 1 dose of MMR after pregnancy and before leaving the healthcare facility. ? Unvaccinated health care workers born before 65 who lack laboratory evidence of measles, mumps, or rubella immunity or laboratory confirmation of disease should consider 2 doses of MMR 18 days apart for measles or mumps, or 1 dose of MMR for rubella.  Pneumococcal vaccines. ? All adults aged 71 years and older should receive 13-valent pneumococcal conjugate vaccine (PCV13) followed by 23-valent  pneumococcal polyscaccharide vaccine (PPSV23) at least 1 year after PCV13. ? An adult aged 65 years or older who has certain medical conditions and has not been previously immunized should receive 1 dose of PCV13 vaccine. This PCV13 should be followed with a dose of pneumococcal polysaccharide (PPSV23) vaccine. The PPSV23 vaccine dose should be obtained at least 8 weeks after the dose of PCV13 vaccine. ? An adult aged 80 years or older who has certain medical conditions and previously received 1 or more doses of PPSV23 vaccine should receive 1 dose of PCV13. The PCV13 vaccine dose should be obtained 1 or more years after the last PPSV23 vaccine dose. ? PPSV23 vaccination should happen in all adults aged 19-64 who smoke cigarettes. ? People with an immunocompromised condition and certain other conditions should receive both PCV13 and PPSV23 vaccines. ? When indicated, people who have unknown immunization and have no record of immunization should receive PPSV23 vaccine. ? People who received 1-2 doses of PPSV23 before age 55 years should receive another dose of PPSV23 vaccine at age 27 years or later if at least 5 years have  passed since the previous dose. ? Doses of PPSV23 are not needed for people immunized with PPSV23 at or after age 45 years.  Meningococcal vaccine. ? Adults with asplenia or persistent complement component deficiencies should receive 2 doses of quadrivalent meningococcal conjugate (MenACWY-D) vaccine. The doses should be obtained at least 2 months apart. Revaccination should occur every 5 years. A 2-dose or 3-dose series of serogroup B meningococcal (MenB)vaccine should also be obtained. ? Microbiologists working with certain meningococcal bacteria, [place] recruits, people at risk during an outbreak, and people who travel to or live in countries with a high rate of meningitis should be immunized. Revaccination is recommended every 5 years if the risk for infection remains. ? Adults  with HIV infection who have not been previously vaccinated should receive a 2-dose MenACWY series with doses at least 2 months apart. If 1 dose was received previously, a second dose should be obtained at least 2 months later. Revaccination is recommended every 5 years. ? A first-year college student up through age 58 years who is living in a residence hall should receive a dose if he or she did not receive a dose on or after his or her 16th birthday. ? Adults aged 69-23 years may receive 2 doses of MenB vaccine for short-term protection against most strains of serogroup B meningococcal disease.  Hepatitis A vaccine. ? Adults who wish to be protected from this disease, have certain high-risk conditions, work with hepatitis A-infected animals, work in hepatitis A research labs, or travel to or work in countries with a high rate of hepatitis A should be immunized. ? Adults who were previously unvaccinated and who anticipate close contact with an international adoptee during the first 60 days after arrival in the [place] from a country with a high rate of hepatitis A should be immunized. ? The vaccine may be given as a 2 or 3-dose series by itself or in combination with the hepatitis B vaccine (HepB).  Hepatitis B vaccine. ? Adults who wish to be protected from this disease, may be exposed to blood or other infectious body fluids, are household contacts or sex partners of hepatitis B positive people, are clients or workers in certain care facilities, or travel to or work in countries with a high rate of hepatitis B should be immunized. ? Adults with chronic liver disease, such as hepatitis C infection, cirrhosis, fatty liver disease, alcoholic liver disease, autoimmune hepatitis, and elevated liver chemistry levels should be vaccinated. ? Pregnant women who are at risk for hepatitis B virus infection during pregnancy should be immunized. ? The vaccine may be given as a 3-dose series by itself or in  combination with the hepatitis A vaccine (HepA).  Haemophilus influenzae type b (Hib) vaccine. ? A previously unvaccinated person with asplenia or sickle cell disease or having a scheduled splenectomy should receive 1 dose of Hib vaccine. This should happen at least 14 days before the procedure. ? Regardless of previous immunization, a recipient of a hematopoietic stem cell transplant should receive a 3-dose series, with at least 4 weeks between doses, 6-12 months after his or her successful transplant. ? Hib vaccine is not recommended for adults with HIV infection.  This information is not intended to replace advice given to you by your health care provider. Make sure you discuss any questions you have with your health care provider. Document Released: 02/18/2004 Document Revised: 08/10/2016 Document Reviewed: 08/10/2016 Elsevier Interactive Patient Education  2018 [employer].

## 2018-11-01 NOTE — Progress Notes (Signed)
Pt request to have titers for school today.

## 2018-11-03 LAB — MEASLES/MUMPS/RUBELLA IMMUNITY
MUMPS ABS, IGG: >300 AU/mL (ref >10.9)
RUBEOLA AB, IGG: >300 AU/mL (ref >16.4)
Rubella Antibodies, IGG: >33 index (ref >0.99)

## 2018-11-03 LAB — VARICELLA ZOSTER ANTIBODY, IGM

## 2018-11-03 LAB — HEPATITIS B SURFACE ANTIGEN: HEP B S AG: NEGATIVE

## 2018-11-03 LAB — VARICELLA ZOSTER ANTIBODY, IGG: Varicella zoster IgG: 1327 index (ref >165)

## 2018-11-03 LAB — HEPATITIS B SURFACE ANTIBODY, QUANTITATIVE

## 2018-11-03 LAB — TETANUS ANTIBODY, IGG: Tetanus Ab, IgG: >7 IU/mL (ref <0.10)

## 2018-11-05 ENCOUNTER — Encounter: Payer: Self-pay | Admitting: Family Medicine

## 2018-11-05 NOTE — Progress Notes (Signed)
Lab letter with results

## 2018-12-19 ENCOUNTER — Telehealth: Payer: Self-pay | Admitting: Family Medicine

## 2018-12-19 NOTE — Telephone Encounter (Signed)
Please advise. You saw patient once in November 2019 for immunization counseling.  This would be initial Rx under your name. Previous PCP was prescribing. Last CPE & pap smear 12/19/2016.

## 2018-12-19 NOTE — Telephone Encounter (Signed)
She will need to schedule a nurse visit for urine HCG. I can refill with negative pregnancy test.

## 2018-12-19 NOTE — Telephone Encounter (Signed)
Patient called requesting SPRINTEC 28 0.25-35 MG-MCG tablet [[id number]]  patient would like a year supply, please follow up

## 2019-01-09 ENCOUNTER — Other Ambulatory Visit: Payer: Self-pay | Admitting: Medical

## 2019-01-11 ENCOUNTER — Other Ambulatory Visit: Payer: Self-pay | Admitting: Medical

## 2019-01-11 NOTE — Telephone Encounter (Signed)
Is this ok to refill?  Looks like she has a new PCP.

## 2019-01-11 NOTE — Telephone Encounter (Signed)
Needs appt.  Call and inquire about new PCP.  Want to make sure she was comfortable with the care here

## 2019-01-14 NOTE — Telephone Encounter (Signed)
Left message on voicemail for patient to call back. 

## 2019-01-16 NOTE — Telephone Encounter (Signed)
Patient called back stating that I called her home number and left message.   She states that she changed the phone numbers around last night.   She states that it was a HIPPAA issue.

## 2019-05-01 ENCOUNTER — Other Ambulatory Visit: Payer: Self-pay

## 2019-05-02 ENCOUNTER — Other Ambulatory Visit: Payer: Self-pay | Admitting: Medical

## 2019-05-02 ENCOUNTER — Ambulatory Visit (INDEPENDENT_AMBULATORY_CARE_PROVIDER_SITE_OTHER): Payer: PPO | Admitting: Medical

## 2019-05-02 ENCOUNTER — Other Ambulatory Visit: Payer: Self-pay

## 2019-05-02 ENCOUNTER — Encounter: Payer: Self-pay | Admitting: Medical

## 2019-05-02 VITALS — BP 110/70 | HR 86 | Temp 97.1°F | Resp 16 | Ht 70.0 in | Wt 162.8 lb

## 2019-05-02 DIAGNOSIS — Z7189 Other specified counseling: Secondary | ICD-10-CM

## 2019-05-02 DIAGNOSIS — Z3041 Encounter for surveillance of contraceptive pills: Secondary | ICD-10-CM | POA: Diagnosis not present

## 2019-05-02 DIAGNOSIS — R011 Cardiac murmur, unspecified: Secondary | ICD-10-CM | POA: Diagnosis not present

## 2019-05-02 DIAGNOSIS — A09 Infectious gastroenteritis and colitis, unspecified: Secondary | ICD-10-CM

## 2019-05-02 DIAGNOSIS — Z7184 Encounter for health counseling related to travel: Secondary | ICD-10-CM

## 2019-05-02 LAB — POCT URINE PREGNANCY: Preg Test, Ur: NEGATIVE

## 2019-05-02 MED ORDER — CIPROFLOXACIN HCL 500 MG PO TABS: ORAL_TABLET | ORAL | 0 refills | Status: AC

## 2019-05-02 MED ORDER — NORGESTIMATE-ETH ESTRADIOL 0.25-35 MG-MCG PO TABS: 1.0000 | ORAL_TABLET | Freq: Every day | ORAL | 0 refills | Status: DC

## 2019-05-02 NOTE — Progress Notes (Signed)
Subjective:  Betty Bentley is a 28 y.o. female who presents for Chief Complaint  Patient presents with  . med check    med check      Here today for med refills.    She needs refill her birth control.  She has done wellness and has been on this several years without complaint.  No prior pregnancy.  Uses condoms, no concern for STD.  [place] [address]  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  Periods are light and regular.  She is finishing her period currently.  She would also like a refill on Cipro which she keeps on hand for either UTI or traveler's diarrhea.  Otherwise been in good state of health, exercising regularly, eating healthy.  No other aggravating or relieving factors.    No other c/o.  The following portions of the patient's history were reviewed and updated as appropriate: allergies, current medications, past family history, past medical history, past social history, past surgical history and problem list.  ROS Otherwise as in subjective above   Objective: BP 110/70   Pulse 86   Temp (!) 97.1 F (36.2 C) (Temporal)   Resp 16   Ht 5\' 10"  (1.778 m)   Wt 162 lb 12.8 oz (73.8 kg)   LMP 04/28/2019 (Approximate)   SpO2 98%   BMI 23.36 kg/m   General appearance: alert, no distress, well developed, well nourished Neck: supple, no lymphadenopathy, no thyromegaly, no masses Heart: 2/6 holosystolic murmur heard best in right upper sternal border, otherwise RRR, normal S1, S2, Lungs: CTA bilaterally, no wheezes, rhonchi, or rales Abdomen: +bs, soft, non tender, non distended, no masses, no hepatomegaly, no splenomegaly Pulses: 2+ radial pulses, 2+ pedal pulses, normal cap refill Ext: no edema     Assessment: Encounter Diagnoses  Name Primary?  . Encounter for surveillance of contraceptive pills Yes  . Traveler's diarrhea   . Counseling on health promotion and disease prevention   . Heart murmur   .  Travel advice encounter      Plan: She had done well on current OCP.  We discussed risks of OCPs including headache, nausea, breast tenderness, irregular bleeding or spotting, possible risks of blood clots, heart attack, stroke.     Discussed advantages of OCPs including possible decreased premenstrual symptoms, improving cramps, regulating cycles, decreasing heavy flow.  Discussed proper use of medication.  She understands the benefits, risks, and wishes to c/t same OCP. Urine pregnancy negative.    Discussed safe sex, STD prevention, condom use.   She declines STD screen today.  The past 2 years she has been living abroad for medical school, and we have been prescribing Cipro to have on hand for UTI or travelers diarrhea.  She has used this sparingly.   Refill today for prn use.   Discussed risks, benefits and proper use.  Discussed dosing for UTI x 3 days vs 5 -7 days for travelers diarrhea.   Heart murmur-reviewed her 2014 echocardiogram.  Consider repeat echocardiogram within the next year  Betty Bentley was seen today for med check.  Diagnoses and all orders for this visit:  Encounter for surveillance of contraceptive pills -     POCT urine pregnancy  Traveler's diarrhea  Counseling on health promotion and disease prevention  Heart murmur  Travel advice encounter Comments: error Orders: -     ciprofloxacin (CIPRO) 500 MG tablet; BID x 3 days for  UTI, BID x 7 days for travelers diarrhea  Other orders -     norgestimate-ethinyl estradiol (SPRINTEC 28) 0.25-35 MG-MCG tablet; Take 1 tablet by mouth daily.    Follow up: yearly for physical

## 2019-05-02 NOTE — Patient Instructions (Addendum)
Food Poisoning and Traveling Food poisoning is an illness that is caused by eating or drinking something that has been contaminated with toxins. Some types of food poisoning trigger symptoms within a few hours. Others may take 1-2 weeks for symptoms to appear. Symptoms of food poisoning may include:  Diarrhea.  Cramping or abdominal pain.  Fever.  Nausea and vomiting.  Dizziness.  Aches and pains. Before you travel, learn about the foodborne illnesses that are common in the areas you will be visiting. The risk for food poisoning varies from country to country and from one region of the world to another. What types of illness can be passed through food and drinks? Most cases of food poisoning are caused by bacteria or viruses. Untreated, these cases usually last 2-7 days. Food poisoning can also be caused by some microscopic parasites. Parasites are organisms that live off another larger organism. Illness caused by parasites can take 1-2 weeks to appear and may last several months. What actions can I take to lower my risk of food poisoning while traveling?      Wash your hands with soap and water often, especially before eating food.  Carry travel-size bottles of alcohol-based hand sanitizer. Use it when you do not have access to soap and water.  Understand what foods and drinks are generally safe, and what foods and drinks to avoid. Foods that are generally safe  Food that is thoroughly cooked.  Food that is served hot.  Hard-boiled eggs.  Fruits and vegetables that you wash and peel yourself.  Cheese that has been treated with high heat (pasteurized). Drinks that are generally safe  Pasteurized milk.  Bottled waters, soda, or sports drinks.  Drinks that you know were sealed until you opened them.  Water that you know has been treated, boiled, or filtered to remove microorganisms.  Ice from treated or bottled water.  Drinks made with boiling water, such as tea or  coffee. Foods to avoid  Raw or undercooked foods.  Raw or runny eggs.  Food that is not hot, such as food that has been on a buffet or picnic table for a while.  Raw fruits or vegetables that have not been washed and peeled.  Other items made with fresh vegetables or fruits, such as salad and salsa.  Cheese that has not been pasteurized.  Meat from local animals, such as monkeys and bats. Drinks to avoid  Water from the tap or a well.  Water from a fresh-water source, such as a stream.  Ice from a tap, well, or fresh-water source.  Beverages that include water from a well, tap, or fresh-water source.  Milk that is not pasteurized.  Beverages from soda fountains. What countries have a higher risk for food poisoning? Countries with a high risk  Countries in [place].  Countries in the [place].  [place].  Countries in [place] and [place]. Countries with a mid-range risk  Countries in [place].  [place].  Some [place]. Countries with a low risk  The [place].  [place].  [place].  [place].  [place].  Some countries in [place] or [place]. Questions to ask your health care provider  What is my personal risk for getting food poisoning while traveling?  Can you prescribe medicines to prevent food poisoning or to treat symptoms like diarrhea?  How do I take the medicines you prescribed?  What over-the-counter medicines can I buy to help with food poisoning?  How do I take care of myself if I think I have food poisoning while traveling?  Does the area I am traveling to have a low, medium, or high risk for foodborne illness? Where to find more information  Visit a travel medicine clinic or speak with a health care provider who specializes in travel medicine as soon as you know your travel plans.  Check the Travelers' Health section on the website of the Centers for Disease Control and  Prevention (CDC): https://barron.com/http://wwwnc.cdc.gov/travel Contact a health care provider if:  You develop symptoms of food poisoning during or after travel. Summary  Food poisoning is an illness that is caused by eating or drinking something contaminated with toxins.  Symptoms can occur within hours or may take 1-2 weeks to appear. Symptoms include diarrhea, cramping, fever, nausea and vomiting, dizziness, aches, and pains.  Before you travel, learn about the foodborne illnesses that are common in the areas you intend to visit. Consider visiting a travel medicine clinic before your trip.  While traveling, be aware of which foods and drinks are generally safe or should be avoided.  Wash your hands with soap and water often. Use alcohol-based hand sanitizer if you do not have access to soap and water. This information is not intended to replace advice given to you by your health care provider. Make sure you discuss any questions you have with your health care provider. Document Released: 02/18/2003 Document Revised: 09/20/2017 Document Reviewed: 09/20/2017 Elsevier Interactive Patient Education  2019 [employer]    Sprintec (Ethinyl Estradiol; Norgestimate tablets) What is this medicine? ETHINYL ESTRADIOL; NORGESTIMATE (ETH in il es tra DYE ole; nor JES ti mate) is an oral contraceptive. The products combine two types of female hormones, an estrogen and a progestin. They are used to prevent ovulation and pregnancy. Some products are also used to treat acne in females. This medicine may be used for other purposes; ask your health care provider or pharmacist if you have questions. COMMON BRAND NAME(S): Estarylla, Mili, MONO-LINYAH, MonoNessa, Norgestimate/Ethinyl Estradiol, Ortho Tri-Cyclen, Ortho Tri-Cyclen Lo, Ortho-Cyclen, Previfem, Sprintec, Tri-Estarylla, TRI-LINYAH, Tri-Lo-Estarylla, Tri-Lo-Marzia, Tri-Lo-Mili, Tri-Lo-Sprintec, Tri-Mili, Tri-Previfem, Tri-Sprintec, Tri-VyLibra, Trinessa, Trinessa  Lo, VyLibra What should I tell my health care provider before I take this medicine? They need to know if you have or ever had any of these conditions: -abnormal vaginal bleeding -blood vessel disease or blood clots -breast, cervical, endometrial, ovarian, liver, or uterine cancer -diabetes -gallbladder disease -heart disease or recent heart attack -high blood pressure -high cholesterol -kidney disease -liver disease -migraine headaches -stroke -systemic lupus erythematosus (SLE) -tobacco smoker -an unusual or allergic reaction to estrogens, progestins, other medicines, foods, dyes, or preservatives -pregnant or trying to get pregnant -breast-feeding How should I use this medicine? Take this medicine by mouth. To reduce nausea, this medicine may be taken with food. Follow the directions on the prescription label. Take this medicine at the same time each day and in the order directed on the package. Do not take your medicine more often than directed. Contact your pediatrician regarding the use of this medicine in children. Special care may be needed. This medicine has been used in female children who have started having menstrual periods. A patient package insert for the product will be given with each prescription and refill. Read this sheet carefully each time. The sheet may change frequently. Overdosage: If you think you have taken too much of this medicine contact a poison control center or emergency room at once. NOTE: This medicine is only  for you. Do not share this medicine with others. What if I miss a dose? If you miss a dose, refer to the patient information sheet you received with your medicine for direction. If you miss more than one pill, this medicine may not be as effective and you may need to use another form of birth control. What may interact with this medicine? Do not take this medicine with the following medication: -dasabuvir; ombitasvir; paritaprevir;  ritonavir -ombitasvir; paritaprevir; ritonavir This medicine may also interact with the following medications: -acetaminophen -antibiotics or medicines for infections, especially rifampin, rifabutin, rifapentine, and griseofulvin, and possibly penicillins or tetracyclines -aprepitant -ascorbic acid (vitamin C) -atorvastatin -barbiturate medicines, such as phenobarbital -bosentan -carbamazepine -caffeine -clofibrate -cyclosporine -dantrolene -doxercalciferol -felbamate -grapefruit juice -hydrocortisone -medicines for anxiety or sleeping problems, such as diazepam or temazepam -medicines for diabetes, including pioglitazone -mineral oil -modafinil -mycophenolate -nefazodone -oxcarbazepine -phenytoin -prednisolone -ritonavir or other medicines for HIV infection or AIDS -rosuvastatin -selegiline -soy isoflavones supplements -St. John's wort -tamoxifen or raloxifene -theophylline -thyroid hormones -topiramate -warfarin This list may not describe all possible interactions. Give your health care provider a list of all the medicines, herbs, non-prescription drugs, or dietary supplements you use. Also tell them if you smoke, drink alcohol, or use illegal drugs. Some items may interact with your medicine. What should I watch for while using this medicine? Visit your doctor or health care professional for regular checks on your progress. You will need a regular breast and pelvic exam and Pap smear while on this medicine. You should also discuss the need for regular mammograms with your health care professional, and follow his or her guidelines for these tests. This medicine can make your body retain fluid, making your fingers, hands, or ankles swell. Your blood pressure can go up. Contact your doctor or health care professional if you feel you are retaining fluid. Use an additional method of contraception during the first cycle that you take these tablets. If you have any reason to  think you are pregnant, stop taking this medicine right away and contact your doctor or health care professional. If you are taking this medicine for hormone related problems, it may take several cycles of use to see improvement in your condition. Do not use this product if you smoke and are over 20 years of age. Smoking increases the risk of getting a blood clot or having a stroke while you are taking birth control pills, especially if you are more than 27 years old. If you are a smoker who is 31 years of age or younger, you are strongly advised not to smoke while taking birth control pills. This medicine can make you more sensitive to the sun. Keep out of the sun. If you cannot avoid being in the sun, wear protective clothing and use sunscreen. Do not use sun lamps or tanning beds/booths. If you wear contact lenses and notice visual changes, or if the lenses begin to feel uncomfortable, consult your eye care specialist. In some women, tenderness, swelling, or minor bleeding of the gums may occur. Notify your dentist if this happens. Brushing and flossing your teeth regularly may help limit this. See your dentist regularly and inform your dentist of the medicines you are taking. If you are going to have elective surgery, you may need to stop taking this medicine before the surgery. Consult your health care professional for advice. This medicine does not protect you against HIV infection (AIDS) or any other sexually transmitted diseases. What side effects may  I notice from receiving this medicine? Side effects that you should report to your doctor or health care professional as soon as possible: -breast tissue changes or discharge -changes in vaginal bleeding during your period or between your periods -chest pain -coughing up blood -dizziness or fainting spells -headaches or migraines -leg, arm or groin pain -severe or sudden headaches -stomach pain (severe) -sudden shortness of breath -sudden  loss of coordination, especially on one side of the body -speech problems -symptoms of vaginal infection like itching, irritation or unusual discharge -tenderness in the upper abdomen -vomiting -weakness or numbness in the arms or legs, especially on one side of the body -yellowing of the eyes or skin Side effects that usually do not require medical attention (report to your doctor or health care professional if they continue or are bothersome): -breakthrough bleeding and spotting that continues beyond the 3 initial cycles of pills -breast tenderness -mood changes, anxiety, depression, frustration, anger, or emotional outbursts -increased sensitivity to sun or ultraviolet light -nausea -skin rash, acne, or brown spots on the skin -weight gain (slight) This list may not describe all possible side effects. Call your doctor for medical advice about side effects. You may report side effects to FDA at 1-800-FDA-1088. Where should I keep my medicine? Keep out of the reach of children. Store at room temperature between 15 and 30 degrees C (59 and 86 degrees F). Throw away any unused medicine after the expiration date. NOTE: This sheet is a summary. It may not cover all possible information. If you have questions about this medicine, talk to your doctor, pharmacist, or health care provider.  2019 Elsevier/Gold Standard (2016-08-08 08:09:09)

## 2020-03-17 ENCOUNTER — Other Ambulatory Visit: Payer: Self-pay

## 2020-03-18 ENCOUNTER — Telehealth: Payer: Self-pay | Admitting: Family Medicine

## 2020-03-18 ENCOUNTER — Other Ambulatory Visit: Payer: Self-pay | Admitting: Medical

## 2020-03-18 MED ORDER — NORGESTIMATE-ETH ESTRADIOL 0.25-35 MG-MCG PO TABS: 1.0000 | ORAL_TABLET | Freq: Every day | ORAL | 0 refills | Status: AC

## 2020-03-18 NOTE — Telephone Encounter (Signed)
Pt called, she is still out of country and will be back at the end of summer and can come in then.  She is on her last pack of birth control pills and needs refill to cover her through end of summer.  Please call pt [phone] and advise.

## 2020-12-22 ENCOUNTER — Ambulatory Visit: Payer: Self-pay | Attending: Internal Medicine

## 2020-12-22 DIAGNOSIS — Z23 Encounter for immunization: Secondary | ICD-10-CM

## 2020-12-22 NOTE — Progress Notes (Signed)
   Covid-19 Vaccination Clinic  Name:  Betty Bentley    MRN: [id number] DOB: 08-04-1991  12/22/2020  [name] was observed post Covid-19 immunization for 15 minutes without incident. She was provided with Vaccine Information Sheet and instruction to access the V-Safe system.   [name] was instructed to call 911 with any severe reactions post vaccine: Marland Kitchen Difficulty breathing  . Swelling of face and throat  . A fast heartbeat  . A bad rash all over body  . Dizziness and weakness   Immunizations Administered    Name Date Dose VIS Date Route   Pfizer COVID-19 Vaccine 12/22/2020  1:31 PM 0.3 mL 09/30/2020 Intramuscular   Manufacturer: [employer], [employer]   Lot: [id number]   NDC: [address]-9

## 2022-09-05 ENCOUNTER — Telehealth: Payer: Self-pay | Admitting: Medical

## 2022-09-05 NOTE — Telephone Encounter (Signed)
Received a records request from [facility]. Forwarding to [employer]
